# Patient Record
Sex: Male | Born: 1960 | Race: White | Hispanic: No | Smoking: Current every day smoker
Health system: Southern US, Community
[De-identification: ages and names within clinical notes are randomized; demographics above are authoritative.]

## PROBLEM LIST (undated history)

## (undated) DIAGNOSIS — I219 Acute myocardial infarction, unspecified: Secondary | ICD-10-CM

## (undated) DIAGNOSIS — J189 Pneumonia, unspecified organism: Secondary | ICD-10-CM

## (undated) DIAGNOSIS — I255 Ischemic cardiomyopathy: Secondary | ICD-10-CM

## (undated) DIAGNOSIS — Z951 Presence of aortocoronary bypass graft: Secondary | ICD-10-CM

## (undated) DIAGNOSIS — J449 Chronic obstructive pulmonary disease, unspecified: Secondary | ICD-10-CM

## (undated) DIAGNOSIS — I2102 ST elevation (STEMI) myocardial infarction involving left anterior descending coronary artery: Secondary | ICD-10-CM

## (undated) DIAGNOSIS — F17200 Nicotine dependence, unspecified, uncomplicated: Secondary | ICD-10-CM

## (undated) DIAGNOSIS — K279 Peptic ulcer, site unspecified, unspecified as acute or chronic, without hemorrhage or perforation: Secondary | ICD-10-CM

## (undated) DIAGNOSIS — I10 Essential (primary) hypertension: Secondary | ICD-10-CM

## (undated) DIAGNOSIS — I2510 Atherosclerotic heart disease of native coronary artery without angina pectoris: Secondary | ICD-10-CM

## (undated) DIAGNOSIS — E785 Hyperlipidemia, unspecified: Secondary | ICD-10-CM

## (undated) HISTORY — PX: CORONARY ANGIOPLASTY WITH STENT PLACEMENT: SHX49

## (undated) HISTORY — DX: Ischemic cardiomyopathy: I25.5

## (undated) HISTORY — DX: Pneumonia, unspecified organism: J18.9

## (undated) HISTORY — DX: Chronic obstructive pulmonary disease, unspecified: J44.9

## (undated) HISTORY — PX: FRACTURE SURGERY: SHX138

## (undated) HISTORY — DX: Peptic ulcer, site unspecified, unspecified as acute or chronic, without hemorrhage or perforation: K27.9

---

## 1997-01-17 ENCOUNTER — Emergency Department
Admit: 1997-01-17 | Disposition: A | Discharge status: Home / Self Care | Payer: Self-pay | Admitting: Emergency Medicine

## 1998-01-25 ENCOUNTER — Emergency Department (HOSPITAL_COMMUNITY): Admission: EM | Admit: 1998-01-25 | Discharge: 1998-01-25 | Payer: Self-pay | Admitting: Emergency Medicine

## 1998-01-25 ENCOUNTER — Encounter: Payer: Self-pay | Admitting: Emergency Medicine

## 1998-08-28 ENCOUNTER — Emergency Department (HOSPITAL_COMMUNITY): Admission: EM | Admit: 1998-08-28 | Discharge: 1998-08-28 | Payer: Self-pay | Admitting: Emergency Medicine

## 1999-03-01 ENCOUNTER — Emergency Department (HOSPITAL_COMMUNITY): Admission: EM | Admit: 1999-03-01 | Discharge: 1999-03-01 | Payer: Self-pay | Admitting: *Deleted

## 1999-03-18 ENCOUNTER — Other Ambulatory Visit: Payer: Self-pay

## 1999-03-18 ENCOUNTER — Ambulatory Visit: Admit: 1999-03-18 | Disposition: A | Discharge status: Home / Self Care | Payer: Self-pay

## 1999-08-07 ENCOUNTER — Emergency Department (HOSPITAL_COMMUNITY): Admission: EM | Admit: 1999-08-07 | Discharge: 1999-08-07 | Payer: Self-pay | Admitting: Emergency Medicine

## 2000-12-19 ENCOUNTER — Emergency Department: Admit: 2000-12-19 | Disposition: A | Discharge status: Home / Self Care | Payer: Self-pay

## 2000-12-19 ENCOUNTER — Emergency Department
Admit: 2000-12-19 | Disposition: A | Discharge status: Home / Self Care | Payer: Self-pay | Admitting: Emergency Medicine

## 2001-02-18 ENCOUNTER — Emergency Department (HOSPITAL_COMMUNITY): Admission: EM | Admit: 2001-02-18 | Discharge: 2001-02-18 | Payer: Self-pay | Admitting: Emergency Medicine

## 2005-11-06 ENCOUNTER — Emergency Department
Admission: EM | Admit: 2005-11-06 | Disposition: A | Discharge status: Home / Self Care | Payer: Self-pay | Source: Emergency Department | Admitting: Emergency Medicine

## 2009-12-24 ENCOUNTER — Emergency Department (HOSPITAL_BASED_OUTPATIENT_CLINIC_OR_DEPARTMENT_OTHER): Admission: EM | Admit: 2009-12-24 | Discharge: 2009-12-24 | Payer: Self-pay | Admitting: Emergency Medicine

## 2012-09-08 ENCOUNTER — Emergency Department (HOSPITAL_COMMUNITY): Payer: Self-pay

## 2012-09-08 ENCOUNTER — Encounter (HOSPITAL_COMMUNITY): Payer: Self-pay | Admitting: Emergency Medicine

## 2012-09-08 ENCOUNTER — Emergency Department (HOSPITAL_COMMUNITY)
Admission: EM | Admit: 2012-09-08 | Discharge: 2012-09-08 | Disposition: A | Discharge status: Home / Self Care | Payer: Self-pay | Attending: Emergency Medicine | Admitting: Emergency Medicine

## 2012-09-08 DIAGNOSIS — J40 Bronchitis, not specified as acute or chronic: Secondary | ICD-10-CM

## 2012-09-08 DIAGNOSIS — J449 Chronic obstructive pulmonary disease, unspecified: Secondary | ICD-10-CM

## 2012-09-08 DIAGNOSIS — R0789 Other chest pain: Secondary | ICD-10-CM | POA: Insufficient documentation

## 2012-09-08 DIAGNOSIS — R059 Cough, unspecified: Secondary | ICD-10-CM | POA: Insufficient documentation

## 2012-09-08 DIAGNOSIS — I252 Old myocardial infarction: Secondary | ICD-10-CM | POA: Insufficient documentation

## 2012-09-08 DIAGNOSIS — J209 Acute bronchitis, unspecified: Secondary | ICD-10-CM | POA: Insufficient documentation

## 2012-09-08 DIAGNOSIS — J440 Chronic obstructive pulmonary disease with acute lower respiratory infection: Secondary | ICD-10-CM | POA: Insufficient documentation

## 2012-09-08 DIAGNOSIS — F17200 Nicotine dependence, unspecified, uncomplicated: Secondary | ICD-10-CM | POA: Insufficient documentation

## 2012-09-08 DIAGNOSIS — R05 Cough: Secondary | ICD-10-CM | POA: Insufficient documentation

## 2012-09-08 DIAGNOSIS — Z9861 Coronary angioplasty status: Secondary | ICD-10-CM | POA: Insufficient documentation

## 2012-09-08 HISTORY — DX: Acute myocardial infarction, unspecified: I21.9

## 2012-09-08 LAB — CBC WITH DIFFERENTIAL/PLATELET
Basophils Absolute: 0 10*3/uL (ref 0.0–0.1)
Basophils Relative: 0 % (ref 0–1)
Hemoglobin: 17.9 g/dL — ABNORMAL HIGH (ref 13.0–17.0)
MCHC: 35.2 g/dL (ref 30.0–36.0)
Neutro Abs: 4.6 10*3/uL (ref 1.7–7.7)
Neutrophils Relative %: 68 % (ref 43–77)
Platelets: 211 10*3/uL (ref 150–400)
RDW: 13.3 % (ref 11.5–15.5)

## 2012-09-08 LAB — COMPREHENSIVE METABOLIC PANEL
AST: 24 U/L (ref 0–37)
Albumin: 3.2 g/dL — ABNORMAL LOW (ref 3.5–5.2)
Alkaline Phosphatase: 87 U/L (ref 39–117)
Chloride: 97 mEq/L (ref 96–112)
Potassium: 3.8 mEq/L (ref 3.5–5.1)
Sodium: 135 mEq/L (ref 135–145)
Total Bilirubin: 0.2 mg/dL — ABNORMAL LOW (ref 0.3–1.2)

## 2012-09-08 MED ORDER — ALBUTEROL SULFATE HFA 108 (90 BASE) MCG/ACT IN AERS: 1.0000 | INHALATION_SPRAY | Freq: Four times a day (QID) | RESPIRATORY_TRACT | Status: DC | PRN

## 2012-09-08 MED ORDER — DOXYCYCLINE HYCLATE 100 MG PO CAPS: 100.0000 mg | ORAL_CAPSULE | Freq: Two times a day (BID) | ORAL | Status: DC

## 2012-09-08 MED ORDER — ALBUTEROL SULFATE (5 MG/ML) 0.5% IN NEBU
2.5000 mg | INHALATION_SOLUTION | Freq: Once | RESPIRATORY_TRACT | Status: AC
  Administered 2012-09-08: 2.5 mg via RESPIRATORY_TRACT
  Filled 2012-09-08: qty 0.5

## 2012-09-08 MED ORDER — IPRATROPIUM BROMIDE 0.02 % IN SOLN
0.5000 mg | Freq: Once | RESPIRATORY_TRACT | Status: AC
  Administered 2012-09-08: 0.5 mg via RESPIRATORY_TRACT
  Filled 2012-09-08: qty 2.5

## 2012-09-08 MED ORDER — PREDNISONE 20 MG PO TABS
60.0000 mg | ORAL_TABLET | Freq: Once | ORAL | Status: AC
  Administered 2012-09-08: 60 mg via ORAL
  Filled 2012-09-08: qty 3

## 2012-09-08 MED ORDER — PREDNISONE 50 MG PO TABS: ORAL_TABLET | ORAL | Status: DC

## 2012-09-08 NOTE — ED Notes (Signed)
Brother's number to reach mother /brother  [phone]

## 2012-09-08 NOTE — ED Provider Notes (Signed)
History     CSN: [id number]  Arrival date & time 09/08/12  1256   First MD Initiated Contact with Patient 09/08/12 1304      Chief Complaint  Patient presents with  . Cough  . Shortness of Breath    (Consider location/radiation/quality/duration/timing/severity/associated sxs/prior treatment) HPI Comments:  patient presents with a four-day history of shortness of breath and nonproductive cough. He is an active smoker of 2 packs a day. He states he's had a MI 6 years ago with a stent he denies any chest pain similar to his MI but does have some discomfort with coughing. No fever, chills, nausea vomiting or abdominal pain. No leg pain or swelling. No recent travel. No hemoptysis. No previous admissions for breathing problems. Denies any medications at home. Pain in his chest is sharp and stabbing lasts a second at a time with coughing.   The history is provided by the patient.    Past Medical History  Diagnosis Date  . MI (myocardial infarction)     Past Surgical History  Procedure Laterality Date  . Coronary angioplasty with stent placement    . Fracture surgery      History reviewed. No pertinent family history.  History  Substance Use Topics  . Smoking status: Current Every Day Smoker -- 3.00 packs/day  . Smokeless tobacco: Not on file  . Alcohol Use: No      Review of Systems  Constitutional: Negative for fever, activity change and appetite change.  HENT: Negative for congestion and rhinorrhea.   Respiratory: Positive for cough, chest tightness and shortness of breath.   Cardiovascular: Negative for chest pain.  Gastrointestinal: Negative for nausea, vomiting and abdominal pain.  Genitourinary: Negative for dysuria and hematuria.  Musculoskeletal: Negative for back pain.    Allergies  Review of patient's allergies indicates no known allergies.  Home Medications   Current Outpatient Rx  Name  Route  Sig  Dispense  Refill  . ibuprofen (ADVIL,MOTRIN) 200 MG  tablet   Oral   Take 200 mg by mouth every 6 (six) hours as needed for pain (pain).         . pseudoephedrine-guaifenesin (MUCINEX D) 60-600 MG per tablet   Oral   Take 1 tablet by mouth every 12 (twelve) hours.           BP 125/85  Pulse 81  Temp(Src) 98.2 F (36.8 C) (Oral)  Resp 26  SpO2 99%  Physical Exam  Constitutional: He is oriented to person, place, and time. He appears well-developed and well-nourished. No distress.  HENT:  Head: Normocephalic and atraumatic.  Mouth/Throat: Oropharynx is clear and moist. No oropharyngeal exudate.  Eyes: Conjunctivae and EOM are normal. Pupils are equal, round, and reactive to light.  Neck: Normal range of motion. Neck supple.  Cardiovascular: Normal rate, regular rhythm and normal heart sounds.   No murmur heard. Pulmonary/Chest: Effort normal and breath sounds normal. No respiratory distress.  Decreased breath sounds bilaterally with scattered respiratory wheezing  Abdominal: Soft. There is no tenderness. There is no rebound and no guarding.  Musculoskeletal: Normal range of motion. He exhibits no edema and no tenderness.  Neurological: He is alert and oriented to person, place, and time. No cranial nerve deficit. He exhibits normal muscle tone. Coordination normal.  Skin: Skin is warm.    ED Course  Procedures (including critical care time)  Labs Reviewed  CBC WITH DIFFERENTIAL - Abnormal; Notable for the following:    Hemoglobin 17.9 (*)  Monocytes Relative 13 (*)    All other components within normal limits  COMPREHENSIVE METABOLIC PANEL - Abnormal; Notable for the following:    Albumin 3.2 (*)    Total Bilirubin 0.2 (*)    GFR calc non Af Amer 77 (*)    GFR calc Af Amer 89 (*)    All other components within normal limits  TROPONIN I  D-DIMER, QUANTITATIVE  PRO B NATRIURETIC PEPTIDE   Dg Chest 2 View  09/08/2012  *RADIOLOGY REPORT*  Clinical Data: Shortness of breath, cough and chills.  CHEST - 2 VIEW   Comparison: None.  Findings: Mild and diffuse interstitial prominence of the lungs may be consistent with tobacco use/chronic disease.  No focal infiltrate, nodule, edema or pleural fluid is seen.  The heart size is normal.  The bony thorax is unremarkable.  IMPRESSION: No active disease.  Suspect underlying chronic interstitial lung disease.   Original Report Authenticated By: Irish Lack, M.D.      No diagnosis found.    MDM  Four-day history of shortness of breath with nonproductive cough and chest pain with coughing. Active smoker. Previous MI with stent.  Chest x-ray negative for infiltrate. Patient given nebs, steroids with improvement in shortness of breath and with breathing. He is not wheezing on exam.  troponin negative. D-dimer negative. Suspect undiagnosed COPD complicated by bronchitis.  Resource guide given.  Ambulatory in hallways without desaturation.   Date: 09/08/2012  Rate: 84  Rhythm: normal sinus rhythm  QRS Axis: normal  Intervals: normal  ST/T Wave abnormalities: normal  Conduction Disutrbances:none  Narrative Interpretation:   Old EKG Reviewed: none available       Glynn Octave, MD 09/08/12 1553

## 2012-09-08 NOTE — ED Notes (Signed)
Patient ambulated in hallway. Maintained 02 saturation between 93% and 97%.

## 2012-09-08 NOTE — ED Notes (Addendum)
Pt states that he has had a cough & sob x 2 days.  States it is hard to take a deep breath.  Hx of MI and stent placement.

## 2013-12-18 ENCOUNTER — Encounter (HOSPITAL_COMMUNITY)
Admission: EM | Disposition: A | Discharge status: Home / Self Care | Payer: Self-pay | Source: Home / Self Care | Attending: Surgery

## 2013-12-18 ENCOUNTER — Inpatient Hospital Stay (HOSPITAL_COMMUNITY): Payer: Self-pay | Admitting: Certified Registered"

## 2013-12-18 ENCOUNTER — Encounter (HOSPITAL_COMMUNITY): Payer: Self-pay | Admitting: Emergency Medicine

## 2013-12-18 ENCOUNTER — Inpatient Hospital Stay (HOSPITAL_COMMUNITY): Payer: Self-pay

## 2013-12-18 ENCOUNTER — Encounter (HOSPITAL_COMMUNITY): Payer: MEDICAID | Admitting: Certified Registered"

## 2013-12-18 ENCOUNTER — Emergency Department (HOSPITAL_COMMUNITY): Payer: Self-pay

## 2013-12-18 ENCOUNTER — Other Ambulatory Visit: Payer: Self-pay | Admitting: *Deleted

## 2013-12-18 ENCOUNTER — Inpatient Hospital Stay (HOSPITAL_COMMUNITY)
Admission: EM | Admit: 2013-12-18 | Discharge: 2013-12-23 | DRG: 232 | Disposition: A | Discharge status: Home / Self Care | Payer: Self-pay | Attending: Surgery | Admitting: Surgery

## 2013-12-18 DIAGNOSIS — K5900 Constipation, unspecified: Secondary | ICD-10-CM | POA: Diagnosis not present

## 2013-12-18 DIAGNOSIS — I2510 Atherosclerotic heart disease of native coronary artery without angina pectoris: Secondary | ICD-10-CM

## 2013-12-18 DIAGNOSIS — E119 Type 2 diabetes mellitus without complications: Secondary | ICD-10-CM | POA: Diagnosis present

## 2013-12-18 DIAGNOSIS — I159 Secondary hypertension, unspecified: Secondary | ICD-10-CM

## 2013-12-18 DIAGNOSIS — F17200 Nicotine dependence, unspecified, uncomplicated: Secondary | ICD-10-CM | POA: Diagnosis present

## 2013-12-18 DIAGNOSIS — I219 Acute myocardial infarction, unspecified: Secondary | ICD-10-CM

## 2013-12-18 DIAGNOSIS — F411 Generalized anxiety disorder: Secondary | ICD-10-CM | POA: Diagnosis not present

## 2013-12-18 DIAGNOSIS — I517 Cardiomegaly: Secondary | ICD-10-CM

## 2013-12-18 DIAGNOSIS — I10 Essential (primary) hypertension: Secondary | ICD-10-CM | POA: Diagnosis present

## 2013-12-18 DIAGNOSIS — Z01810 Encounter for preprocedural cardiovascular examination: Secondary | ICD-10-CM

## 2013-12-18 DIAGNOSIS — I2582 Chronic total occlusion of coronary artery: Secondary | ICD-10-CM | POA: Diagnosis present

## 2013-12-18 DIAGNOSIS — Z9861 Coronary angioplasty status: Secondary | ICD-10-CM

## 2013-12-18 DIAGNOSIS — D62 Acute posthemorrhagic anemia: Secondary | ICD-10-CM | POA: Diagnosis not present

## 2013-12-18 DIAGNOSIS — E8779 Other fluid overload: Secondary | ICD-10-CM | POA: Diagnosis not present

## 2013-12-18 DIAGNOSIS — I2109 ST elevation (STEMI) myocardial infarction involving other coronary artery of anterior wall: Principal | ICD-10-CM | POA: Diagnosis present

## 2013-12-18 DIAGNOSIS — I2102 ST elevation (STEMI) myocardial infarction involving left anterior descending coronary artery: Secondary | ICD-10-CM | POA: Diagnosis present

## 2013-12-18 DIAGNOSIS — I213 ST elevation (STEMI) myocardial infarction of unspecified site: Secondary | ICD-10-CM

## 2013-12-18 DIAGNOSIS — E785 Hyperlipidemia, unspecified: Secondary | ICD-10-CM | POA: Diagnosis present

## 2013-12-18 DIAGNOSIS — I252 Old myocardial infarction: Secondary | ICD-10-CM

## 2013-12-18 DIAGNOSIS — Z23 Encounter for immunization: Secondary | ICD-10-CM

## 2013-12-18 DIAGNOSIS — Z951 Presence of aortocoronary bypass graft: Secondary | ICD-10-CM

## 2013-12-18 HISTORY — DX: Atherosclerotic heart disease of native coronary artery without angina pectoris: I25.10

## 2013-12-18 HISTORY — PX: INTRAOPERATIVE TRANSESOPHAGEAL ECHOCARDIOGRAM: SHX5062

## 2013-12-18 HISTORY — PX: LEFT HEART CATHETERIZATION WITH CORONARY ANGIOGRAM: SHX5451

## 2013-12-18 HISTORY — DX: Essential (primary) hypertension: I10

## 2013-12-18 HISTORY — DX: Presence of aortocoronary bypass graft: Z95.1

## 2013-12-18 HISTORY — DX: Hyperlipidemia, unspecified: E78.5

## 2013-12-18 HISTORY — DX: ST elevation (STEMI) myocardial infarction involving left anterior descending coronary artery: I21.02

## 2013-12-18 HISTORY — PX: CORONARY ARTERY BYPASS GRAFT: SHX141

## 2013-12-18 HISTORY — DX: Nicotine dependence, unspecified, uncomplicated: F17.200

## 2013-12-18 LAB — MRSA PCR SCREENING: MRSA BY PCR: NEGATIVE

## 2013-12-18 LAB — COMPREHENSIVE METABOLIC PANEL
ALBUMIN: 4.1 g/dL (ref 3.5–5.2)
ALBUMIN: 3.6 g/dL (ref 3.5–5.2)
ALK PHOS: 98 U/L (ref 39–117)
ALT: 17 U/L (ref 0–53)
ALT: 19 U/L (ref 0–53)
ANION GAP: 17 — AB (ref 5–15)
AST: 33 U/L (ref 0–37)
AST: 53 U/L — AB (ref 0–37)
Alkaline Phosphatase: 117 U/L (ref 39–117)
Anion gap: 15 (ref 5–15)
BUN: 12 mg/dL (ref 6–23)
BUN: 10 mg/dL (ref 6–23)
CALCIUM: 9.8 mg/dL (ref 8.4–10.5)
CHLORIDE: 99 meq/L (ref 96–112)
CO2: 22 mEq/L (ref 19–32)
CO2: 26 mEq/L (ref 19–32)
Calcium: 8.9 mg/dL (ref 8.4–10.5)
Chloride: 98 mEq/L (ref 96–112)
Creatinine, Ser: 0.98 mg/dL (ref 0.50–1.35)
Creatinine, Ser: 0.85 mg/dL (ref 0.50–1.35)
GFR calc Af Amer: >90 mL/min (ref >90)
GFR calc non Af Amer: >90 mL/min (ref >90)
GFR calc non Af Amer: >90 mL/min (ref >90)
GLUCOSE: 121 mg/dL — AB (ref 70–99)
Glucose, Bld: 103 mg/dL — ABNORMAL HIGH (ref 70–99)
POTASSIUM: 4 meq/L (ref 3.7–5.3)
Potassium: 4.1 mEq/L (ref 3.7–5.3)
SODIUM: 137 meq/L (ref 137–147)
Sodium: 140 mEq/L (ref 137–147)
TOTAL PROTEIN: 8.7 g/dL — AB (ref 6.0–8.3)
Total Bilirubin: 0.5 mg/dL (ref 0.3–1.2)
Total Bilirubin: 0.5 mg/dL (ref 0.3–1.2)
Total Protein: 7.5 g/dL (ref 6.0–8.3)

## 2013-12-18 LAB — CBC WITH DIFFERENTIAL/PLATELET
BASOS ABS: 0 10*3/uL (ref 0.0–0.1)
Basophils Relative: 0 % (ref 0–1)
Eosinophils Absolute: 0 10*3/uL (ref 0.0–0.7)
Eosinophils Relative: 0 % (ref 0–5)
HCT: 47.9 % (ref 39.0–52.0)
HEMOGLOBIN: 16.3 g/dL (ref 13.0–17.0)
LYMPHS ABS: 1.3 10*3/uL (ref 0.7–4.0)
Lymphocytes Relative: 11 % — ABNORMAL LOW (ref 12–46)
MCH: 30.5 pg (ref 26.0–34.0)
MCHC: 34 g/dL (ref 30.0–36.0)
MCV: 89.7 fL (ref 78.0–100.0)
MONO ABS: 0.6 10*3/uL (ref 0.1–1.0)
MONOS PCT: 5 % (ref 3–12)
NEUTROS PCT: 84 % — AB (ref 43–77)
Neutro Abs: 10 10*3/uL — ABNORMAL HIGH (ref 1.7–7.7)
Platelets: 266 10*3/uL (ref 150–400)
RBC: 5.34 MIL/uL (ref 4.22–5.81)
RDW: 13.9 % (ref 11.5–15.5)
WBC: 11.8 10*3/uL — ABNORMAL HIGH (ref 4.0–10.5)

## 2013-12-18 LAB — POCT I-STAT 3, ART BLOOD GAS (G3+)
ACID-BASE DEFICIT: 2 mmol/L (ref 0.0–2.0)
ACID-BASE DEFICIT: 1 mmol/L (ref 0.0–2.0)
ACID-BASE DEFICIT: 2 mmol/L (ref 0.0–2.0)
Acid-base deficit: 1 mmol/L (ref 0.0–2.0)
Acid-base deficit: 1 mmol/L (ref 0.0–2.0)
Acid-base deficit: 4 mmol/L — ABNORMAL HIGH (ref 0.0–2.0)
Acid-base deficit: 3 mmol/L — ABNORMAL HIGH (ref 0.0–2.0)
BICARBONATE: 23.7 meq/L (ref 20.0–24.0)
Bicarbonate: 25.3 mEq/L — ABNORMAL HIGH (ref 20.0–24.0)
Bicarbonate: 21.7 mEq/L (ref 20.0–24.0)
Bicarbonate: 24.4 mEq/L — ABNORMAL HIGH (ref 20.0–24.0)
Bicarbonate: 24.2 mEq/L — ABNORMAL HIGH (ref 20.0–24.0)
Bicarbonate: 22.8 mEq/L (ref 20.0–24.0)
Bicarbonate: 23.4 mEq/L (ref 20.0–24.0)
O2 SAT: 100 %
O2 Saturation: 100 %
O2 Saturation: 84 %
O2 Saturation: 94 %
O2 Saturation: 93 %
O2 Saturation: 95 %
O2 Saturation: 94 %
PCO2 ART: 46.2 mmHg — AB (ref 35.0–45.0)
PCO2 ART: 36 mmHg (ref 35.0–45.0)
PCO2 ART: 39.7 mmHg (ref 35.0–45.0)
PH ART: 7.346 — AB (ref 7.350–7.450)
PH ART: 7.337 — AB (ref 7.350–7.450)
PH ART: 7.358 (ref 7.350–7.450)
PO2 ART: 502 mmHg — AB (ref 80.0–100.0)
PO2 ART: 52 mmHg — AB (ref 80.0–100.0)
PO2 ART: 76 mmHg — AB (ref 80.0–100.0)
PO2 ART: 69 mmHg — AB (ref 80.0–100.0)
PO2 ART: 77 mmHg — AB (ref 80.0–100.0)
PO2 ART: 73 mmHg — AB (ref 80.0–100.0)
Patient temperature: 37.2
Patient temperature: 37.2
TCO2: 27 mmol/L (ref 0–100)
TCO2: 23 mmol/L (ref 0–100)
TCO2: 26 mmol/L (ref 0–100)
TCO2: 25 mmol/L (ref 0–100)
TCO2: 25 mmol/L (ref 0–100)
TCO2: 24 mmol/L (ref 0–100)
TCO2: 25 mmol/L (ref 0–100)
pCO2 arterial: 40.6 mmHg (ref 35.0–45.0)
pCO2 arterial: 42.3 mmHg (ref 35.0–45.0)
pCO2 arterial: 47.3 mmHg — ABNORMAL HIGH (ref 35.0–45.0)
pCO2 arterial: 42.8 mmHg (ref 35.0–45.0)
pH, Arterial: 7.369 (ref 7.350–7.450)
pH, Arterial: 7.307 — ABNORMAL LOW (ref 7.350–7.450)
pH, Arterial: 7.411 (ref 7.350–7.450)
pH, Arterial: 7.38 (ref 7.350–7.450)
pO2, Arterial: 347 mmHg — ABNORMAL HIGH (ref 80.0–100.0)

## 2013-12-18 LAB — CBC
HCT: 51.4 % (ref 39.0–52.0)
HEMATOCRIT: 40.8 % (ref 39.0–52.0)
HEMOGLOBIN: 18 g/dL — AB (ref 13.0–17.0)
HEMOGLOBIN: 13.9 g/dL (ref 13.0–17.0)
MCH: 30.7 pg (ref 26.0–34.0)
MCH: 30.2 pg (ref 26.0–34.0)
MCHC: 35 g/dL (ref 30.0–36.0)
MCHC: 34.1 g/dL (ref 30.0–36.0)
MCV: 87.7 fL (ref 78.0–100.0)
MCV: 88.5 fL (ref 78.0–100.0)
Platelets: 322 10*3/uL (ref 150–400)
Platelets: 186 10*3/uL (ref 150–400)
RBC: 5.86 MIL/uL — ABNORMAL HIGH (ref 4.22–5.81)
RBC: 4.61 MIL/uL (ref 4.22–5.81)
RDW: 13.5 % (ref 11.5–15.5)
RDW: 13.9 % (ref 11.5–15.5)
WBC: 16.3 10*3/uL — ABNORMAL HIGH (ref 4.0–10.5)
WBC: 20.7 10*3/uL — ABNORMAL HIGH (ref 4.0–10.5)

## 2013-12-18 LAB — PULMONARY FUNCTION TEST
FEF 25-75 Post: 1.1 L/sec
FEF 25-75 Pre: 1.33 L/sec
FEF2575-%Change-Post: -17 %
FEF2575-%Pred-Post: 30 %
FEF2575-%Pred-Pre: 36 %
FEV1-%Change-Post: -13 %
FEV1-%PRED-POST: 32 %
FEV1-%Pred-Pre: 37 %
FEV1-POST: 1.36 L
FEV1-Pre: 1.57 L
FEV1FVC-%CHANGE-POST: -11 %
FEV1FVC-%Pred-Pre: 93 %
FEV6-%Change-Post: -1 %
FEV6-%Pred-Post: 40 %
FEV6-%Pred-Pre: 41 %
FEV6-Post: 2.11 L
FEV6-Pre: 2.15 L
FEV6FVC-%PRED-PRE: 104 %
FEV6FVC-%Pred-Post: 104 %
FVC-%Change-Post: -1 %
FVC-%PRED-POST: 39 %
FVC-%Pred-Pre: 40 %
FVC-PRE: 2.15 L
FVC-Post: 2.11 L
POST FEV6/FVC RATIO: 100 %
PRE FEV6/FVC RATIO: 100 %
Post FEV1/FVC ratio: 64 %
Pre FEV1/FVC ratio: 73 %

## 2013-12-18 LAB — TROPONIN I
Troponin I: 9.48 ng/mL (ref <0.30)
Troponin I: 19.91 ng/mL (ref <0.30)

## 2013-12-18 LAB — POCT I-STAT, CHEM 8
BUN: 10 mg/dL (ref 6–23)
BUN: 5 mg/dL — ABNORMAL LOW (ref 6–23)
BUN: 7 mg/dL (ref 6–23)
BUN: 6 mg/dL (ref 6–23)
BUN: 7 mg/dL (ref 6–23)
CALCIUM ION: 0.89 mmol/L — AB (ref 1.12–1.23)
CALCIUM ION: 1.24 mmol/L — AB (ref 1.12–1.23)
CHLORIDE: 105 meq/L (ref 96–112)
CHLORIDE: 109 meq/L (ref 96–112)
CHLORIDE: 101 meq/L (ref 96–112)
CREATININE: 0.7 mg/dL (ref 0.50–1.35)
CREATININE: 0.6 mg/dL (ref 0.50–1.35)
Calcium, Ion: 1.16 mmol/L (ref 1.12–1.23)
Calcium, Ion: 1.23 mmol/L (ref 1.12–1.23)
Calcium, Ion: 1.04 mmol/L — ABNORMAL LOW (ref 1.12–1.23)
Chloride: 111 mEq/L (ref 96–112)
Chloride: 99 mEq/L (ref 96–112)
Creatinine, Ser: 0.9 mg/dL (ref 0.50–1.35)
Creatinine, Ser: 0.5 mg/dL (ref 0.50–1.35)
Creatinine, Ser: 0.7 mg/dL (ref 0.50–1.35)
GLUCOSE: 130 mg/dL — AB (ref 70–99)
GLUCOSE: 95 mg/dL (ref 70–99)
GLUCOSE: 130 mg/dL — AB (ref 70–99)
Glucose, Bld: 100 mg/dL — ABNORMAL HIGH (ref 70–99)
Glucose, Bld: 89 mg/dL (ref 70–99)
HCT: 51 % (ref 39.0–52.0)
HCT: 40 % (ref 39.0–52.0)
HCT: 29 % — ABNORMAL LOW (ref 39.0–52.0)
HCT: 35 % — ABNORMAL LOW (ref 39.0–52.0)
HEMATOCRIT: 31 % — AB (ref 39.0–52.0)
HEMOGLOBIN: 17.3 g/dL — AB (ref 13.0–17.0)
HEMOGLOBIN: 13.6 g/dL (ref 13.0–17.0)
Hemoglobin: 10.5 g/dL — ABNORMAL LOW (ref 13.0–17.0)
Hemoglobin: 9.9 g/dL — ABNORMAL LOW (ref 13.0–17.0)
Hemoglobin: 11.9 g/dL — ABNORMAL LOW (ref 13.0–17.0)
POTASSIUM: 3.3 meq/L — AB (ref 3.7–5.3)
POTASSIUM: 4 meq/L (ref 3.7–5.3)
POTASSIUM: 3.4 meq/L — AB (ref 3.7–5.3)
Potassium: 3.6 mEq/L — ABNORMAL LOW (ref 3.7–5.3)
Potassium: 3.3 mEq/L — ABNORMAL LOW (ref 3.7–5.3)
SODIUM: 138 meq/L (ref 137–147)
SODIUM: 141 meq/L (ref 137–147)
Sodium: 135 mEq/L — ABNORMAL LOW (ref 137–147)
Sodium: 134 mEq/L — ABNORMAL LOW (ref 137–147)
Sodium: 141 mEq/L (ref 137–147)
TCO2: 20 mmol/L (ref 0–100)
TCO2: 23 mmol/L (ref 0–100)
TCO2: 26 mmol/L (ref 0–100)
TCO2: 24 mmol/L (ref 0–100)
TCO2: 22 mmol/L (ref 0–100)

## 2013-12-18 LAB — POCT I-STAT 4, (NA,K, GLUC, HGB,HCT)
Glucose, Bld: 113 mg/dL — ABNORMAL HIGH (ref 70–99)
HEMATOCRIT: 41 % (ref 39.0–52.0)
HEMOGLOBIN: 13.9 g/dL (ref 13.0–17.0)
Potassium: 3.5 mEq/L — ABNORMAL LOW (ref 3.7–5.3)
Sodium: 140 mEq/L (ref 137–147)

## 2013-12-18 LAB — I-STAT TROPONIN, ED: Troponin i, poc: 1.17 ng/mL (ref 0.00–0.08)

## 2013-12-18 LAB — SURGICAL PCR SCREEN
MRSA, PCR: NEGATIVE
Staphylococcus aureus: POSITIVE — AB

## 2013-12-18 LAB — PROTIME-INR
INR: 0.99 (ref 0.00–1.49)
INR: 1.17 (ref 0.00–1.49)
INR: 1.33 (ref 0.00–1.49)
PROTHROMBIN TIME: 13.1 s (ref 11.6–15.2)
PROTHROMBIN TIME: 14.9 s (ref 11.6–15.2)
Prothrombin Time: 16.5 seconds — ABNORMAL HIGH (ref 11.6–15.2)

## 2013-12-18 LAB — TYPE AND SCREEN
ABO/RH(D): O POS
Antibody Screen: NEGATIVE

## 2013-12-18 LAB — HEMOGLOBIN A1C
Hgb A1c MFr Bld: 5.6 % (ref <5.7)
Mean Plasma Glucose: 114 mg/dL (ref <117)

## 2013-12-18 LAB — BLOOD GAS, ARTERIAL
ACID-BASE DEFICIT: 0.8 mmol/L (ref 0.0–2.0)
Bicarbonate: 23.5 mEq/L (ref 20.0–24.0)
Drawn by: 24485
FIO2: 0.21 %
O2 Saturation: 94.9 %
Patient temperature: 98.3
TCO2: 24.8 mmol/L (ref 0–100)
pCO2 arterial: 40.1 mmHg (ref 35.0–45.0)
pH, Arterial: 7.386 (ref 7.350–7.450)
pO2, Arterial: 72.7 mmHg — ABNORMAL LOW (ref 80.0–100.0)

## 2013-12-18 LAB — URINALYSIS, ROUTINE W REFLEX MICROSCOPIC
Bilirubin Urine: NEGATIVE
GLUCOSE, UA: NEGATIVE mg/dL
KETONES UR: 40 mg/dL — AB
LEUKOCYTES UA: NEGATIVE
Nitrite: NEGATIVE
PH: 7 (ref 5.0–8.0)
Protein, ur: NEGATIVE mg/dL
Specific Gravity, Urine: >1.046 — ABNORMAL HIGH (ref 1.005–1.030)
Urobilinogen, UA: 1 mg/dL (ref 0.0–1.0)

## 2013-12-18 LAB — HEMOGLOBIN AND HEMATOCRIT, BLOOD
HCT: 31.1 % — ABNORMAL LOW (ref 39.0–52.0)
HEMOGLOBIN: 10.6 g/dL — AB (ref 13.0–17.0)

## 2013-12-18 LAB — GLUCOSE, CAPILLARY
GLUCOSE-CAPILLARY: 113 mg/dL — AB (ref 70–99)
Glucose-Capillary: 110 mg/dL — ABNORMAL HIGH (ref 70–99)
Glucose-Capillary: 108 mg/dL — ABNORMAL HIGH (ref 70–99)
Glucose-Capillary: 106 mg/dL — ABNORMAL HIGH (ref 70–99)
Glucose-Capillary: 104 mg/dL — ABNORMAL HIGH (ref 70–99)

## 2013-12-18 LAB — MAGNESIUM: Magnesium: 2.3 mg/dL (ref 1.5–2.5)

## 2013-12-18 LAB — ABO/RH: ABO/RH(D): O POS

## 2013-12-18 LAB — TSH: TSH: 0.431 u[IU]/mL (ref 0.350–4.500)

## 2013-12-18 LAB — APTT
APTT: 29 s (ref 24–37)
aPTT: 55 seconds — ABNORMAL HIGH (ref 24–37)
aPTT: 32 seconds (ref 24–37)

## 2013-12-18 LAB — POCT ACTIVATED CLOTTING TIME: ACTIVATED CLOTTING TIME: 501 s

## 2013-12-18 LAB — PLATELET COUNT: Platelets: 173 10*3/uL (ref 150–400)

## 2013-12-18 LAB — URINE MICROSCOPIC-ADD ON

## 2013-12-18 SURGERY — CORONARY ARTERY BYPASS GRAFTING (CABG)
Anesthesia: General | Site: Chest

## 2013-12-18 SURGERY — LEFT HEART CATHETERIZATION WITH CORONARY ANGIOGRAM
Anesthesia: LOCAL

## 2013-12-18 MED ORDER — POTASSIUM CHLORIDE 2 MEQ/ML IV SOLN
80.0000 meq | INTRAVENOUS | Status: DC
  Filled 2013-12-18: qty 40

## 2013-12-18 MED ORDER — SODIUM CHLORIDE 0.9 % IV SOLN
INTRAVENOUS | Status: DC
  Administered 2013-12-18: 10 mL via INTRAVENOUS

## 2013-12-18 MED ORDER — NITROGLYCERIN 0.2 MG/ML ON CALL CATH LAB
INTRAVENOUS | Status: AC
Start: 2013-12-18 — End: 2013-12-18
  Filled 2013-12-18: qty 1

## 2013-12-18 MED ORDER — PROPOFOL 10 MG/ML IV BOLUS
INTRAVENOUS | Status: AC
  Filled 2013-12-18: qty 20

## 2013-12-18 MED ORDER — MAGNESIUM SULFATE 50 % IJ SOLN
40.0000 meq | INTRAMUSCULAR | Status: DC
  Filled 2013-12-18: qty 10

## 2013-12-18 MED ORDER — FENTANYL CITRATE 0.05 MG/ML IJ SOLN
INTRAMUSCULAR | Status: AC
  Filled 2013-12-18: qty 5

## 2013-12-18 MED ORDER — DEXMEDETOMIDINE HCL IN NACL 200 MCG/50ML IV SOLN
0.1000 ug/kg/h | INTRAVENOUS | Status: DC
  Administered 2013-12-18: 0.7 ug/kg/h via INTRAVENOUS
  Filled 2013-12-18: qty 500

## 2013-12-18 MED ORDER — THROMBIN 20000 UNITS EX SOLR
OROMUCOSAL | Status: DC | PRN
  Administered 2013-12-18 (×3): via TOPICAL

## 2013-12-18 MED ORDER — ASPIRIN EC 325 MG PO TBEC
325.0000 mg | DELAYED_RELEASE_TABLET | Freq: Every day | ORAL | Status: DC
  Administered 2013-12-19 – 2013-12-20 (×2): 325 mg via ORAL
  Filled 2013-12-18 (×2): qty 1

## 2013-12-18 MED ORDER — MIDAZOLAM HCL 2 MG/2ML IJ SOLN
INTRAMUSCULAR | Status: AC
  Filled 2013-12-18: qty 2

## 2013-12-18 MED ORDER — BISACODYL 5 MG PO TBEC: 5.0000 mg | DELAYED_RELEASE_TABLET | Freq: Once | ORAL | Status: DC

## 2013-12-18 MED ORDER — BIVALIRUDIN 250 MG IV SOLR
INTRAVENOUS | Status: AC
  Filled 2013-12-18: qty 250

## 2013-12-18 MED ORDER — FENTANYL CITRATE 0.05 MG/ML IJ SOLN
INTRAMUSCULAR | Status: DC | PRN
  Administered 2013-12-18: 50 ug via INTRAVENOUS
  Administered 2013-12-18: 200 ug via INTRAVENOUS
  Administered 2013-12-18: 250 ug via INTRAVENOUS
  Administered 2013-12-18: 150 ug via INTRAVENOUS
  Administered 2013-12-18: 100 ug via INTRAVENOUS
  Administered 2013-12-18: 250 ug via INTRAVENOUS
  Administered 2013-12-18: 500 ug via INTRAVENOUS

## 2013-12-18 MED ORDER — DEXTROSE 5 % IV SOLN
0.0000 ug/min | INTRAVENOUS | Status: DC
  Filled 2013-12-18: qty 2

## 2013-12-18 MED ORDER — TEMAZEPAM 15 MG PO CAPS: 15.0000 mg | ORAL_CAPSULE | Freq: Once | ORAL | Status: DC | PRN

## 2013-12-18 MED ORDER — SODIUM CHLORIDE 0.9 % IJ SOLN: 3.0000 mL | INTRAMUSCULAR | Status: DC | PRN

## 2013-12-18 MED ORDER — DEXTROSE 5 % IV SOLN
1.5000 g | Freq: Two times a day (BID) | INTRAVENOUS | Status: AC
  Administered 2013-12-18 – 2013-12-20 (×4): 1.5 g via INTRAVENOUS
  Filled 2013-12-18 (×4): qty 1.5

## 2013-12-18 MED ORDER — CEFUROXIME SODIUM 1.5 G IJ SOLR
1.5000 g | INTRAMUSCULAR | Status: AC
  Administered 2013-12-18: 1.5 g via INTRAVENOUS
  Administered 2013-12-18: .75 g via INTRAVENOUS
  Filled 2013-12-18: qty 1.5

## 2013-12-18 MED ORDER — ALBUTEROL SULFATE (2.5 MG/3ML) 0.083% IN NEBU
2.5000 mg | INHALATION_SOLUTION | Freq: Once | RESPIRATORY_TRACT | Status: AC
  Administered 2013-12-18: 2.5 mg via RESPIRATORY_TRACT

## 2013-12-18 MED ORDER — VECURONIUM BROMIDE 10 MG IV SOLR
INTRAVENOUS | Status: DC | PRN
  Administered 2013-12-18 (×4): 5 mg via INTRAVENOUS

## 2013-12-18 MED ORDER — PHENYLEPHRINE HCL 10 MG/ML IJ SOLN
30.0000 ug/min | INTRAVENOUS | Status: AC
  Administered 2013-12-18: 10 ug/min via INTRAVENOUS
  Filled 2013-12-18: qty 2

## 2013-12-18 MED ORDER — DEXTROSE 5 % IV SOLN
750.0000 mg | INTRAVENOUS | Status: DC
  Filled 2013-12-18: qty 750

## 2013-12-18 MED ORDER — HEPARIN SODIUM (PORCINE) 1000 UNIT/ML IJ SOLN
INTRAMUSCULAR | Status: DC | PRN
  Administered 2013-12-18: 37000 [IU] via INTRAVENOUS

## 2013-12-18 MED ORDER — NITROGLYCERIN 0.4 MG SL SUBL: 0.4000 mg | SUBLINGUAL_TABLET | SUBLINGUAL | Status: DC | PRN

## 2013-12-18 MED ORDER — METOPROLOL TARTRATE 12.5 MG HALF TABLET
12.5000 mg | ORAL_TABLET | Freq: Once | ORAL | Status: AC
  Administered 2013-12-18: 12.5 mg via ORAL
  Filled 2013-12-18: qty 1

## 2013-12-18 MED ORDER — METOPROLOL TARTRATE 1 MG/ML IV SOLN: 2.5000 mg | INTRAVENOUS | Status: DC | PRN

## 2013-12-18 MED ORDER — THROMBIN 20000 UNITS EX SOLR
CUTANEOUS | Status: DC | PRN
  Administered 2013-12-18: 20000 [IU] via TOPICAL

## 2013-12-18 MED ORDER — METOPROLOL TARTRATE 12.5 MG HALF TABLET
12.5000 mg | ORAL_TABLET | Freq: Two times a day (BID) | ORAL | Status: DC
  Administered 2013-12-19 – 2013-12-20 (×2): 12.5 mg via ORAL
  Filled 2013-12-18 (×5): qty 1

## 2013-12-18 MED ORDER — ACETAMINOPHEN 325 MG PO TABS: 650.0000 mg | ORAL_TABLET | ORAL | Status: DC | PRN

## 2013-12-18 MED ORDER — METOCLOPRAMIDE HCL 5 MG/ML IJ SOLN
10.0000 mg | Freq: Four times a day (QID) | INTRAMUSCULAR | Status: AC
  Administered 2013-12-19 (×3): 10 mg via INTRAVENOUS
  Filled 2013-12-18 (×2): qty 2

## 2013-12-18 MED ORDER — ATORVASTATIN CALCIUM 40 MG PO TABS
40.0000 mg | ORAL_TABLET | Freq: Every day | ORAL | Status: DC
  Administered 2013-12-19 – 2013-12-22 (×4): 40 mg via ORAL
  Filled 2013-12-18 (×6): qty 1

## 2013-12-18 MED ORDER — ACETAMINOPHEN 650 MG RE SUPP
650.0000 mg | Freq: Once | RECTAL | Status: AC
  Administered 2013-12-18: 650 mg via RECTAL

## 2013-12-18 MED ORDER — THROMBIN 20000 UNITS EX SOLR
CUTANEOUS | Status: AC
  Filled 2013-12-18: qty 20000

## 2013-12-18 MED ORDER — MIDAZOLAM HCL 5 MG/5ML IJ SOLN
INTRAMUSCULAR | Status: DC | PRN
  Administered 2013-12-18 (×6): 2 mg via INTRAVENOUS

## 2013-12-18 MED ORDER — ASPIRIN 81 MG PO CHEW
CHEWABLE_TABLET | ORAL | Status: AC
  Filled 2013-12-18: qty 4

## 2013-12-18 MED ORDER — SODIUM CHLORIDE 0.9 % IV SOLN
INTRAVENOUS | Status: DC
  Filled 2013-12-18: qty 1

## 2013-12-18 MED ORDER — POTASSIUM CHLORIDE 10 MEQ/50ML IV SOLN
10.0000 meq | INTRAVENOUS | Status: AC
  Administered 2013-12-18 (×3): 10 meq via INTRAVENOUS

## 2013-12-18 MED ORDER — CHLORHEXIDINE GLUCONATE CLOTH 2 % EX PADS: 6.0000 | MEDICATED_PAD | Freq: Once | CUTANEOUS | Status: DC

## 2013-12-18 MED ORDER — ALBUMIN HUMAN 5 % IV SOLN
250.0000 mL | INTRAVENOUS | Status: AC | PRN
  Administered 2013-12-18: 250 mL via INTRAVENOUS

## 2013-12-18 MED ORDER — VERAPAMIL HCL 2.5 MG/ML IV SOLN
INTRAVENOUS | Status: AC
  Filled 2013-12-18: qty 2

## 2013-12-18 MED ORDER — VECURONIUM BROMIDE 10 MG IV SOLR
INTRAVENOUS | Status: AC
  Filled 2013-12-18: qty 10

## 2013-12-18 MED ORDER — ONDANSETRON HCL 4 MG/2ML IJ SOLN
4.0000 mg | Freq: Four times a day (QID) | INTRAMUSCULAR | Status: DC | PRN
  Administered 2013-12-19: 4 mg via INTRAVENOUS
  Filled 2013-12-18: qty 2

## 2013-12-18 MED ORDER — LACTATED RINGERS IV SOLN: 500.0000 mL | Freq: Once | INTRAVENOUS | Status: AC | PRN

## 2013-12-18 MED ORDER — DOPAMINE-DEXTROSE 3.2-5 MG/ML-% IV SOLN
2.0000 ug/kg/min | INTRAVENOUS | Status: DC
  Filled 2013-12-18: qty 250

## 2013-12-18 MED ORDER — BISACODYL 10 MG RE SUPP: 10.0000 mg | Freq: Every day | RECTAL | Status: DC

## 2013-12-18 MED ORDER — INSULIN REGULAR BOLUS VIA INFUSION
0.0000 [IU] | Freq: Three times a day (TID) | INTRAVENOUS | Status: DC
  Filled 2013-12-18: qty 10

## 2013-12-18 MED ORDER — ONDANSETRON HCL 4 MG/2ML IJ SOLN: 4.0000 mg | Freq: Four times a day (QID) | INTRAMUSCULAR | Status: DC | PRN

## 2013-12-18 MED ORDER — SODIUM CHLORIDE 0.9 % IV SOLN
INTRAVENOUS | Status: AC
  Administered 2013-12-18: 1 [IU]/h via INTRAVENOUS
  Filled 2013-12-18: qty 1

## 2013-12-18 MED ORDER — PROTAMINE SULFATE 10 MG/ML IV SOLN
INTRAVENOUS | Status: AC
  Filled 2013-12-18: qty 25

## 2013-12-18 MED ORDER — TIROFIBAN HCL IV 5 MG/100ML
0.1500 ug/kg/min | INTRAVENOUS | Status: DC
  Filled 2013-12-18 (×2): qty 100

## 2013-12-18 MED ORDER — DEXMEDETOMIDINE HCL IN NACL 400 MCG/100ML IV SOLN
0.1000 ug/kg/h | INTRAVENOUS | Status: AC
  Administered 2013-12-18: 0.4 ug/kg/h via INTRAVENOUS
  Filled 2013-12-18: qty 100

## 2013-12-18 MED ORDER — ROCURONIUM BROMIDE 100 MG/10ML IV SOLN
INTRAVENOUS | Status: DC | PRN
  Administered 2013-12-18: 50 mg via INTRAVENOUS

## 2013-12-18 MED ORDER — NITROGLYCERIN IN D5W 200-5 MCG/ML-% IV SOLN
2.0000 ug/min | INTRAVENOUS | Status: DC
  Filled 2013-12-18: qty 250

## 2013-12-18 MED ORDER — PAPAVERINE HCL 30 MG/ML IJ SOLN
INTRAMUSCULAR | Status: AC
  Administered 2013-12-18: 13:00:00
  Filled 2013-12-18: qty 2.5

## 2013-12-18 MED ORDER — MIDAZOLAM HCL 2 MG/2ML IJ SOLN
2.0000 mg | INTRAMUSCULAR | Status: DC | PRN
  Administered 2013-12-18: 2 mg via INTRAVENOUS
  Filled 2013-12-18: qty 2

## 2013-12-18 MED ORDER — MORPHINE SULFATE 4 MG/ML IJ SOLN
4.0000 mg | Freq: Once | INTRAMUSCULAR | Status: AC
  Administered 2013-12-18: 4 mg via INTRAVENOUS

## 2013-12-18 MED ORDER — CHLORHEXIDINE GLUCONATE CLOTH 2 % EX PADS
6.0000 | MEDICATED_PAD | Freq: Every day | CUTANEOUS | Status: AC
  Administered 2013-12-18 – 2013-12-22 (×4): 6 via TOPICAL

## 2013-12-18 MED ORDER — HEPARIN (PORCINE) IN NACL 2-0.9 UNIT/ML-% IJ SOLN
INTRAMUSCULAR | Status: AC
  Filled 2013-12-18: qty 1500

## 2013-12-18 MED ORDER — PROTAMINE SULFATE 10 MG/ML IV SOLN
INTRAVENOUS | Status: DC | PRN
  Administered 2013-12-18: 350 mg via INTRAVENOUS

## 2013-12-18 MED ORDER — EPINEPHRINE HCL 1 MG/ML IJ SOLN
0.5000 ug/min | INTRAVENOUS | Status: DC
  Filled 2013-12-18: qty 4

## 2013-12-18 MED ORDER — ACETAMINOPHEN 500 MG PO TABS
1000.0000 mg | ORAL_TABLET | Freq: Four times a day (QID) | ORAL | Status: DC
  Administered 2013-12-19 – 2013-12-20 (×7): 1000 mg via ORAL
  Filled 2013-12-18 (×11): qty 2

## 2013-12-18 MED ORDER — ASPIRIN EC 81 MG PO TBEC: 81.0000 mg | DELAYED_RELEASE_TABLET | Freq: Every day | ORAL | Status: DC

## 2013-12-18 MED ORDER — TIROFIBAN HCL IV 5 MG/100ML
0.1500 ug/kg/min | INTRAVENOUS | Status: DC
  Filled 2013-12-18 (×3): qty 100

## 2013-12-18 MED ORDER — MORPHINE SULFATE 2 MG/ML IJ SOLN: 2.0000 mg | INTRAMUSCULAR | Status: DC | PRN

## 2013-12-18 MED ORDER — PNEUMOCOCCAL VAC POLYVALENT 25 MCG/0.5ML IJ INJ: 0.5000 mL | INJECTION | INTRAMUSCULAR | Status: DC | PRN

## 2013-12-18 MED ORDER — PANTOPRAZOLE SODIUM 40 MG PO TBEC
40.0000 mg | DELAYED_RELEASE_TABLET | Freq: Every day | ORAL | Status: DC
  Administered 2013-12-20: 40 mg via ORAL
  Filled 2013-12-18: qty 1

## 2013-12-18 MED ORDER — LIDOCAINE HCL (CARDIAC) 20 MG/ML IV SOLN
INTRAVENOUS | Status: AC
  Filled 2013-12-18: qty 5

## 2013-12-18 MED ORDER — ASPIRIN 81 MG PO CHEW: 324.0000 mg | CHEWABLE_TABLET | Freq: Once | ORAL | Status: AC

## 2013-12-18 MED ORDER — METOPROLOL TARTRATE 25 MG/10 ML ORAL SUSPENSION
12.5000 mg | Freq: Two times a day (BID) | ORAL | Status: DC
  Filled 2013-12-18 (×5): qty 5

## 2013-12-18 MED ORDER — SODIUM CHLORIDE 0.9 % IV SOLN: 1.0000 mL/kg/h | INTRAVENOUS | Status: DC

## 2013-12-18 MED ORDER — SODIUM CHLORIDE 0.9 % IJ SOLN: 3.0000 mL | Freq: Two times a day (BID) | INTRAMUSCULAR | Status: DC

## 2013-12-18 MED ORDER — HEMOSTATIC AGENTS (NO CHARGE) OPTIME
TOPICAL | Status: DC | PRN
  Administered 2013-12-18: 1 via TOPICAL

## 2013-12-18 MED ORDER — DOCUSATE SODIUM 100 MG PO CAPS
200.0000 mg | ORAL_CAPSULE | Freq: Every day | ORAL | Status: DC
  Administered 2013-12-19 – 2013-12-20 (×2): 200 mg via ORAL
  Filled 2013-12-18 (×2): qty 2

## 2013-12-18 MED ORDER — ARTIFICIAL TEARS OP OINT
TOPICAL_OINTMENT | OPHTHALMIC | Status: DC | PRN
  Administered 2013-12-18: 1 via OPHTHALMIC

## 2013-12-18 MED ORDER — VANCOMYCIN HCL 10 G IV SOLR
1250.0000 mg | INTRAVENOUS | Status: AC
  Administered 2013-12-18: 1250 mg via INTRAVENOUS
  Filled 2013-12-18: qty 1250

## 2013-12-18 MED ORDER — HEPARIN SODIUM (PORCINE) 1000 UNIT/ML IJ SOLN
INTRAMUSCULAR | Status: AC
  Filled 2013-12-18: qty 1

## 2013-12-18 MED ORDER — NITROGLYCERIN IN D5W 200-5 MCG/ML-% IV SOLN
2.0000 ug/min | INTRAVENOUS | Status: AC
  Administered 2013-12-18: 5 ug/min via INTRAVENOUS
  Filled 2013-12-18: qty 250

## 2013-12-18 MED ORDER — BIVALIRUDIN 250 MG IV SOLR
INTRAVENOUS | Status: AC
Start: 2013-12-18 — End: 2013-12-18
  Filled 2013-12-18: qty 250

## 2013-12-18 MED ORDER — MAGNESIUM SULFATE 4000MG/100ML IJ SOLN
4.0000 g | Freq: Once | INTRAMUSCULAR | Status: AC
  Administered 2013-12-18: 4 g via INTRAVENOUS
  Filled 2013-12-18: qty 100

## 2013-12-18 MED ORDER — NITROGLYCERIN IN D5W 200-5 MCG/ML-% IV SOLN: 0.0000 ug/min | INTRAVENOUS | Status: DC

## 2013-12-18 MED ORDER — ASPIRIN 81 MG PO CHEW: 324.0000 mg | CHEWABLE_TABLET | Freq: Every day | ORAL | Status: DC

## 2013-12-18 MED ORDER — LACTATED RINGERS IV SOLN
INTRAVENOUS | Status: DC
  Administered 2013-12-19: 20 mL/h via INTRAVENOUS

## 2013-12-18 MED ORDER — CHLORHEXIDINE GLUCONATE 0.12 % MT SOLN: 15.0000 mL | Freq: Once | OROMUCOSAL | Status: DC

## 2013-12-18 MED ORDER — ROCURONIUM BROMIDE 50 MG/5ML IV SOLN
INTRAVENOUS | Status: AC
  Filled 2013-12-18: qty 1

## 2013-12-18 MED ORDER — ACETAMINOPHEN 160 MG/5ML PO SOLN: 650.0000 mg | Freq: Once | ORAL | Status: AC

## 2013-12-18 MED ORDER — MUPIROCIN 2 % EX OINT
1.0000 "application " | TOPICAL_OINTMENT | Freq: Two times a day (BID) | CUTANEOUS | Status: DC
  Administered 2013-12-19 – 2013-12-23 (×9): 1 via NASAL
  Filled 2013-12-18 (×2): qty 22

## 2013-12-18 MED ORDER — CHLORHEXIDINE GLUCONATE 4 % EX LIQD
CUTANEOUS | Status: AC
  Administered 2013-12-18: 09:00:00
  Filled 2013-12-18: qty 15

## 2013-12-18 MED ORDER — SODIUM CHLORIDE 0.9 % IV SOLN: 250.0000 mL | INTRAVENOUS | Status: DC | PRN

## 2013-12-18 MED ORDER — DEXMEDETOMIDINE HCL IN NACL 400 MCG/100ML IV SOLN
0.1000 ug/kg/h | INTRAVENOUS | Status: DC
  Filled 2013-12-18: qty 100

## 2013-12-18 MED ORDER — ASPIRIN 81 MG PO CHEW
324.0000 mg | CHEWABLE_TABLET | Freq: Once | ORAL | Status: AC
  Administered 2013-12-18: 324 mg via ORAL

## 2013-12-18 MED ORDER — MIDAZOLAM HCL 10 MG/2ML IJ SOLN
INTRAMUSCULAR | Status: AC
  Filled 2013-12-18: qty 2

## 2013-12-18 MED ORDER — ACETAMINOPHEN 160 MG/5ML PO SOLN
1000.0000 mg | Freq: Four times a day (QID) | ORAL | Status: DC
  Filled 2013-12-18: qty 40

## 2013-12-18 MED ORDER — LACTATED RINGERS IV SOLN
INTRAVENOUS | Status: DC | PRN
  Administered 2013-12-18 (×6): via INTRAVENOUS

## 2013-12-18 MED ORDER — PLASMA-LYTE 148 IV SOLN
INTRAVENOUS | Status: DC
  Filled 2013-12-18: qty 2.5

## 2013-12-18 MED ORDER — TIROFIBAN HCL IV 12.5 MG/250 ML
INTRAVENOUS | Status: AC
Start: 2013-12-18 — End: 2013-12-18
  Filled 2013-12-18: qty 250

## 2013-12-18 MED ORDER — PERFLUTREN LIPID MICROSPHERE
1.0000 mL | INTRAVENOUS | Status: DC | PRN
  Administered 2013-12-18: 2 mL via INTRAVENOUS
  Filled 2013-12-18: qty 10

## 2013-12-18 MED ORDER — BISACODYL 5 MG PO TBEC
10.0000 mg | DELAYED_RELEASE_TABLET | Freq: Every day | ORAL | Status: DC
  Administered 2013-12-19 – 2013-12-20 (×2): 10 mg via ORAL
  Filled 2013-12-18 (×2): qty 2

## 2013-12-18 MED ORDER — ARTIFICIAL TEARS OP OINT
TOPICAL_OINTMENT | OPHTHALMIC | Status: AC
  Filled 2013-12-18: qty 3.5

## 2013-12-18 MED ORDER — INSULIN ASPART 100 UNIT/ML ~~LOC~~ SOLN
0.0000 [IU] | SUBCUTANEOUS | Status: DC
  Administered 2013-12-19: 2 [IU] via SUBCUTANEOUS

## 2013-12-18 MED ORDER — SODIUM CHLORIDE 0.9 % IV SOLN
INTRAVENOUS | Status: DC
  Filled 2013-12-18: qty 30

## 2013-12-18 MED ORDER — HEPARIN (PORCINE) IN NACL 100-0.45 UNIT/ML-% IJ SOLN
1500.0000 [IU]/h | INTRAMUSCULAR | Status: DC
  Filled 2013-12-18 (×2): qty 250

## 2013-12-18 MED ORDER — PERFLUTREN LIPID MICROSPHERE
INTRAVENOUS | Status: AC
  Filled 2013-12-18: qty 10

## 2013-12-18 MED ORDER — FAMOTIDINE IN NACL 20-0.9 MG/50ML-% IV SOLN
20.0000 mg | Freq: Two times a day (BID) | INTRAVENOUS | Status: AC
  Administered 2013-12-18 – 2013-12-19 (×2): 20 mg via INTRAVENOUS
  Filled 2013-12-18: qty 50

## 2013-12-18 MED ORDER — CALCIUM CHLORIDE 10 % IV SOLN
INTRAVENOUS | Status: DC | PRN
  Administered 2013-12-18: 100 mg via INTRAVENOUS
  Administered 2013-12-18 (×2): 200 mg via INTRAVENOUS
  Administered 2013-12-18: 100 mg via INTRAVENOUS

## 2013-12-18 MED ORDER — SODIUM CHLORIDE 0.9 % IV SOLN
INTRAVENOUS | Status: DC
  Administered 2013-12-18 (×2): via INTRAVENOUS

## 2013-12-18 MED ORDER — SODIUM CHLORIDE 0.9 % IV SOLN
INTRAVENOUS | Status: AC
  Administered 2013-12-18: 70 mL/h via INTRAVENOUS
  Filled 2013-12-18: qty 40

## 2013-12-18 MED ORDER — DEXTROSE 5 % IV SOLN
1.5000 g | INTRAVENOUS | Status: DC
  Filled 2013-12-18: qty 1.5

## 2013-12-18 MED ORDER — CALCIUM CHLORIDE 10 % IV SOLN
INTRAVENOUS | Status: AC
  Filled 2013-12-18: qty 10

## 2013-12-18 MED ORDER — NITROGLYCERIN 0.4 MG SL SUBL
SUBLINGUAL_TABLET | SUBLINGUAL | Status: AC
  Administered 2013-12-18: 0.4 mg via SUBLINGUAL
  Filled 2013-12-18: qty 1

## 2013-12-18 MED ORDER — ALBUMIN HUMAN 5 % IV SOLN
INTRAVENOUS | Status: DC | PRN
  Administered 2013-12-18: 18:00:00 via INTRAVENOUS

## 2013-12-18 MED ORDER — SODIUM CHLORIDE 0.9 % IV SOLN: 250.0000 mL | INTRAVENOUS | Status: DC

## 2013-12-18 MED ORDER — SODIUM CHLORIDE 0.9 % IJ SOLN
3.0000 mL | Freq: Two times a day (BID) | INTRAMUSCULAR | Status: DC
Start: 2013-12-19 — End: 2013-12-20
  Administered 2013-12-19 – 2013-12-20 (×3): 3 mL via INTRAVENOUS

## 2013-12-18 MED ORDER — SODIUM CHLORIDE 0.9 % IV SOLN
INTRAVENOUS | Status: DC
  Filled 2013-12-18: qty 40

## 2013-12-18 MED ORDER — ONDANSETRON HCL 4 MG/2ML IJ SOLN
4.0000 mg | Freq: Once | INTRAMUSCULAR | Status: AC
  Administered 2013-12-18: 4 mg via INTRAVENOUS
  Filled 2013-12-18: qty 2

## 2013-12-18 MED ORDER — PHENYLEPHRINE HCL 10 MG/ML IJ SOLN
30.0000 ug/min | INTRAVENOUS | Status: DC
  Filled 2013-12-18: qty 2

## 2013-12-18 MED ORDER — DOPAMINE-DEXTROSE 3.2-5 MG/ML-% IV SOLN
2.0000 ug/kg/min | INTRAVENOUS | Status: AC
  Administered 2013-12-18: 3 ug/kg/min via INTRAVENOUS
  Filled 2013-12-18: qty 250

## 2013-12-18 MED ORDER — LIDOCAINE HCL (PF) 1 % IJ SOLN
INTRAMUSCULAR | Status: AC
Start: 2013-12-18 — End: 2013-12-18
  Filled 2013-12-18: qty 30

## 2013-12-18 MED ORDER — FENTANYL CITRATE 0.05 MG/ML IJ SOLN
INTRAMUSCULAR | Status: AC
  Filled 2013-12-18: qty 2

## 2013-12-18 MED ORDER — HEPARIN SODIUM (PORCINE) 5000 UNIT/ML IJ SOLN
4000.0000 [IU] | INTRAMUSCULAR | Status: AC
  Administered 2013-12-18: 4000 [IU] via INTRAVENOUS
  Filled 2013-12-18: qty 0.8

## 2013-12-18 MED ORDER — SODIUM CHLORIDE 0.45 % IV SOLN
INTRAVENOUS | Status: DC
  Administered 2013-12-18: 20 mL/h via INTRAVENOUS

## 2013-12-18 MED ORDER — VANCOMYCIN HCL IN DEXTROSE 1-5 GM/200ML-% IV SOLN
1000.0000 mg | Freq: Once | INTRAVENOUS | Status: AC
  Administered 2013-12-19: 1000 mg via INTRAVENOUS
  Filled 2013-12-18: qty 200

## 2013-12-18 MED ORDER — OXYCODONE HCL 5 MG PO TABS
5.0000 mg | ORAL_TABLET | ORAL | Status: DC | PRN
  Administered 2013-12-19 – 2013-12-20 (×4): 10 mg via ORAL
  Filled 2013-12-18 (×5): qty 2

## 2013-12-18 MED ORDER — METOPROLOL TARTRATE 12.5 MG HALF TABLET
12.5000 mg | ORAL_TABLET | Freq: Two times a day (BID) | ORAL | Status: DC
  Filled 2013-12-18 (×2): qty 1

## 2013-12-18 MED ORDER — MORPHINE SULFATE 2 MG/ML IJ SOLN
1.0000 mg | INTRAMUSCULAR | Status: AC | PRN
  Administered 2013-12-18: 2 mg via INTRAVENOUS
  Administered 2013-12-18: 4 mg via INTRAVENOUS

## 2013-12-18 MED ORDER — VANCOMYCIN HCL 10 G IV SOLR
1250.0000 mg | INTRAVENOUS | Status: DC
  Filled 2013-12-18: qty 1250

## 2013-12-18 MED ORDER — MORPHINE SULFATE 2 MG/ML IJ SOLN
2.0000 mg | INTRAMUSCULAR | Status: DC | PRN
  Administered 2013-12-18 – 2013-12-19 (×2): 4 mg via INTRAVENOUS
  Administered 2013-12-19: 2 mg via INTRAVENOUS
  Filled 2013-12-18 (×2): qty 2
  Filled 2013-12-18: qty 1
  Filled 2013-12-18: qty 2
  Filled 2013-12-18: qty 1

## 2013-12-18 MED ORDER — PROPOFOL 10 MG/ML IV BOLUS
INTRAVENOUS | Status: DC | PRN
  Administered 2013-12-18: 120 mg via INTRAVENOUS
  Administered 2013-12-18: 250 mg via INTRAVENOUS

## 2013-12-18 MED ORDER — PROTAMINE SULFATE 10 MG/ML IV SOLN
INTRAVENOUS | Status: AC
  Filled 2013-12-18: qty 10

## 2013-12-18 MED ORDER — 0.9 % SODIUM CHLORIDE (POUR BTL) OPTIME
TOPICAL | Status: DC | PRN
  Administered 2013-12-18: 1000 mL

## 2013-12-18 SURGICAL SUPPLY — 104 items
ATTRACTOMAT 16X20 MAGNETIC DRP (DRAPES) ×4 IMPLANT
BAG DECANTER FOR FLEXI CONT (MISCELLANEOUS) ×4 IMPLANT
BANDAGE ELASTIC 4 VELCRO ST LF (GAUZE/BANDAGES/DRESSINGS) ×4 IMPLANT
BANDAGE ELASTIC 6 VELCRO ST LF (GAUZE/BANDAGES/DRESSINGS) ×4 IMPLANT
BANDAGE GAUZE ELAST BULKY 4 IN (GAUZE/BANDAGES/DRESSINGS) ×4 IMPLANT
BASKET HEART  (ORDER IN 25'S) (MISCELLANEOUS) ×1
BASKET HEART (ORDER IN 25'S) (MISCELLANEOUS) ×1
BASKET HEART (ORDER IN 25S) (MISCELLANEOUS) ×2 IMPLANT
BLADE 11 SAFETY STRL DISP (BLADE) ×4 IMPLANT
BLADE STERNUM SYSTEM 6 (BLADE) ×4 IMPLANT
BLADE SURG ROTATE 9660 (MISCELLANEOUS) ×4 IMPLANT
CANISTER SUCTION 2500CC (MISCELLANEOUS) ×4 IMPLANT
CANNULA ARTERIAL NVNT 3/8 22FR (MISCELLANEOUS) ×4 IMPLANT
CARDIAC SUCTION (MISCELLANEOUS) ×4 IMPLANT
CATH ROBINSON RED A/P 18FR (CATHETERS) ×8 IMPLANT
CATH THORACIC 28FR (CATHETERS) ×4 IMPLANT
CATH THORACIC 36FR (CATHETERS) ×4 IMPLANT
CATH THORACIC 36FR RT ANG (CATHETERS) ×4 IMPLANT
CLIP TI MEDIUM 24 (CLIP) IMPLANT
CLIP TI WIDE RED SMALL 24 (CLIP) ×4 IMPLANT
COVER SURGICAL LIGHT HANDLE (MISCELLANEOUS) ×4 IMPLANT
CRADLE DONUT ADULT HEAD (MISCELLANEOUS) ×4 IMPLANT
DRAPE CARDIOVASCULAR INCISE (DRAPES) ×4
DRAPE SLUSH/WARMER DISC (DRAPES) ×4 IMPLANT
DRAPE SRG 135X102X78XABS (DRAPES) ×2 IMPLANT
DRSG COVADERM 4X14 (GAUZE/BANDAGES/DRESSINGS) ×4 IMPLANT
ELECT CAUTERY BLADE 6.4 (BLADE) ×4 IMPLANT
ELECT REM PT RETURN 9FT ADLT (ELECTROSURGICAL) ×8
ELECTRODE REM PT RTRN 9FT ADLT (ELECTROSURGICAL) ×4 IMPLANT
GLOVE BIO SURGEON STRL SZ 6 (GLOVE) ×24 IMPLANT
GLOVE BIO SURGEON STRL SZ 6.5 (GLOVE) ×21 IMPLANT
GLOVE BIO SURGEON STRL SZ7 (GLOVE) IMPLANT
GLOVE BIO SURGEON STRL SZ7.5 (GLOVE) IMPLANT
GLOVE BIO SURGEONS STRL SZ 6.5 (GLOVE) ×7
GLOVE BIOGEL PI IND STRL 6 (GLOVE) IMPLANT
GLOVE BIOGEL PI IND STRL 6.5 (GLOVE) ×16 IMPLANT
GLOVE BIOGEL PI IND STRL 7.0 (GLOVE) IMPLANT
GLOVE BIOGEL PI INDICATOR 6 (GLOVE)
GLOVE BIOGEL PI INDICATOR 6.5 (GLOVE) ×16
GLOVE BIOGEL PI INDICATOR 7.0 (GLOVE)
GLOVE EUDERMIC 7 POWDERFREE (GLOVE) ×8 IMPLANT
GLOVE ORTHO TXT STRL SZ7.5 (GLOVE) IMPLANT
GOWN STRL REUS W/ TWL LRG LVL3 (GOWN DISPOSABLE) ×20 IMPLANT
GOWN STRL REUS W/ TWL XL LVL3 (GOWN DISPOSABLE) ×2 IMPLANT
GOWN STRL REUS W/TWL LRG LVL3 (GOWN DISPOSABLE) ×40
GOWN STRL REUS W/TWL XL LVL3 (GOWN DISPOSABLE) ×4
HEMOSTAT POWDER SURGIFOAM 1G (HEMOSTASIS) ×12 IMPLANT
HEMOSTAT SURGICEL 2X14 (HEMOSTASIS) ×4 IMPLANT
INSERT FOGARTY 61MM (MISCELLANEOUS) IMPLANT
INSERT FOGARTY XLG (MISCELLANEOUS) IMPLANT
KIT BASIN OR (CUSTOM PROCEDURE TRAY) ×4 IMPLANT
KIT CATH CPB BARTLE (MISCELLANEOUS) ×4 IMPLANT
KIT ROOM TURNOVER OR (KITS) ×4 IMPLANT
KIT SUCTION CATH 14FR (SUCTIONS) ×4 IMPLANT
KIT VASOVIEW W/TROCAR VH 2000 (KITS) ×4 IMPLANT
NS IRRIG 1000ML POUR BTL (IV SOLUTION) ×24 IMPLANT
PACK OPEN HEART (CUSTOM PROCEDURE TRAY) ×4 IMPLANT
PAD ARMBOARD 7.5X6 YLW CONV (MISCELLANEOUS) ×8 IMPLANT
PAD ELECT DEFIB RADIOL ZOLL (MISCELLANEOUS) ×4 IMPLANT
PENCIL BUTTON HOLSTER BLD 10FT (ELECTRODE) ×4 IMPLANT
PUNCH AORTIC ROTATE 4.0MM (MISCELLANEOUS) IMPLANT
PUNCH AORTIC ROTATE 4.5MM 8IN (MISCELLANEOUS) ×4 IMPLANT
PUNCH AORTIC ROTATE 5MM 8IN (MISCELLANEOUS) IMPLANT
SET CARDIOPLEGIA MPS 5001102 (MISCELLANEOUS) ×4 IMPLANT
SOLUTION ANTI FOG 6CC (MISCELLANEOUS) ×4 IMPLANT
SPONGE GAUZE 4X4 12PLY (GAUZE/BANDAGES/DRESSINGS) ×8 IMPLANT
SPONGE INTESTINAL PEANUT (DISPOSABLE) IMPLANT
SPONGE LAP 18X18 X RAY DECT (DISPOSABLE) ×4 IMPLANT
SPONGE LAP 4X18 X RAY DECT (DISPOSABLE) ×4 IMPLANT
SUT BONE WAX W31G (SUTURE) ×4 IMPLANT
SUT MNCRL AB 4-0 PS2 18 (SUTURE) IMPLANT
SUT PROLENE 3 0 SH DA (SUTURE) IMPLANT
SUT PROLENE 3 0 SH1 36 (SUTURE) ×4 IMPLANT
SUT PROLENE 4 0 RB 1 (SUTURE)
SUT PROLENE 4 0 SH DA (SUTURE) IMPLANT
SUT PROLENE 4-0 RB1 .5 CRCL 36 (SUTURE) IMPLANT
SUT PROLENE 5 0 C 1 36 (SUTURE) IMPLANT
SUT PROLENE 6 0 C 1 30 (SUTURE) ×4 IMPLANT
SUT PROLENE 7 0 BV 1 (SUTURE) IMPLANT
SUT PROLENE 7 0 BV1 MDA (SUTURE) ×8 IMPLANT
SUT PROLENE 8 0 BV175 6 (SUTURE) IMPLANT
SUT SILK  1 MH (SUTURE)
SUT SILK 1 MH (SUTURE) IMPLANT
SUT STEEL STERNAL CCS#1 18IN (SUTURE) IMPLANT
SUT STEEL SZ 6 DBL 3X14 BALL (SUTURE) ×12 IMPLANT
SUT VIC AB 1 CTX 36 (SUTURE) ×8
SUT VIC AB 1 CTX36XBRD ANBCTR (SUTURE) ×4 IMPLANT
SUT VIC AB 2-0 CT1 27 (SUTURE) ×4
SUT VIC AB 2-0 CT1 TAPERPNT 27 (SUTURE) ×2 IMPLANT
SUT VIC AB 2-0 CTX 27 (SUTURE) IMPLANT
SUT VIC AB 3-0 SH 27 (SUTURE)
SUT VIC AB 3-0 SH 27X BRD (SUTURE) IMPLANT
SUT VIC AB 3-0 X1 27 (SUTURE) IMPLANT
SUT VICRYL 4-0 PS2 18IN ABS (SUTURE) ×4 IMPLANT
SUTURE E-PAK OPEN HEART (SUTURE) ×4 IMPLANT
SYSTEM SAHARA CHEST DRAIN ATS (WOUND CARE) ×4 IMPLANT
TAPE CLOTH SURG 4X10 WHT LF (GAUZE/BANDAGES/DRESSINGS) ×4 IMPLANT
TOWEL OR 17X24 6PK STRL BLUE (TOWEL DISPOSABLE) ×4 IMPLANT
TOWEL OR 17X26 10 PK STRL BLUE (TOWEL DISPOSABLE) ×4 IMPLANT
TOWEL OR NON WOVEN STRL DISP B (DISPOSABLE) ×4 IMPLANT
TRAY FOLEY IC TEMP SENS 16FR (CATHETERS) ×4 IMPLANT
TUBING INSUFFLATION 10FT LAP (TUBING) ×4 IMPLANT
UNDERPAD 30X30 INCONTINENT (UNDERPADS AND DIAPERS) ×4 IMPLANT
WATER STERILE IRR 1000ML POUR (IV SOLUTION) ×8 IMPLANT

## 2013-12-18 NOTE — Progress Notes (Signed)
  Echocardiogram Echocardiogram Transesophageal has been performed.  Arvil Chaco 12/18/2013, 3:30 PM

## 2013-12-18 NOTE — Progress Notes (Addendum)
VASCULAR LAB PRELIMINARY  PRELIMINARY  PRELIMINARY  PRELIMINARY  Pre-op Cardiac Surgery  Carotid Findings:  Right - 40% to 59% ICA stenosis. Vertebral artery flow is antegrade. Left - 1% to 39% ICA stenosis. Vertebral artery flow is antegrade.  Upper Extremity Right Left  Brachial Pressures 115 Triphasic 126 Triphasic  Radial Waveforms Triphasic Triphasic  Ulnar Waveforms Triphasic Triphasic  Palmar Arch (Allen's Test) Normla Abnormal   Findings:  Doppler waveforms remained normal on the right with both radial and ulnar compressions. Left Doppler waveforms remained normal with radial compression and diminished greater than 50% with ulnar compression.    Lower  Extremity Right Left  Dorsalis Pedis    Anterior Tibial    Posterior Tibial    Ankle/Brachial Indices      Findings:  Palpable pulses bilaterally   Lee May, RVS 12/18/2013, 9:12 AM

## 2013-12-18 NOTE — ED Notes (Signed)
To cath lab.

## 2013-12-18 NOTE — Progress Notes (Signed)
Utilization review completed. Glayds Insco, RN, BSN. 

## 2013-12-18 NOTE — ED Notes (Signed)
Patient arrived from [facility] via [facility]

## 2013-12-18 NOTE — H&P (Signed)
Admit date: 12/18/2013 Referring Physician:  Dr. Hyacinth Meeker Primary Cardiologist:  NONE Chief complaint/reason for admission: Chest pain  HPI: This is a 53yo WM with a history of CAD s/p remote MI with PCI of unknown vessel about 6 years ago in [place].  He has not followed up with a Cardiologist since.  He says that he probably has HTN and dyslipidemia but has not been taking any medications.  He was in his USOH until around MN while trying to go to sleep he developed severe 10/10 sharp CP with right arm numbness.  He got diaphoretic and nauseated.  He said it was worse supine and improved sitting up.  The pain persisted and he decided to come to the ER.  In the ER he was noted to have some mild ST segment elevation in the anterolateral leads and Troponin was elevated at 1.17.  He was given SL NTG with some improvement in his CP and now rates it a 6/10.     PMH:    Past Medical History  Diagnosis Date  . MI (myocardial infarction)   . CAD (coronary artery disease)   . Hypertension     PSH:    Past Surgical History  Procedure Laterality Date  . Coronary angioplasty with stent placement    . Fracture surgery      ALLERGIES:   Review of patient's allergies indicates no known allergies.  Prior to Admit Meds:   (Not in a hospital admission) Family HX:    Family History  Problem Relation Age of Onset  . Cancer Other   . CAD Other   . Cancer Father    Social HX:    History   Social History  . Marital Status: Divorced    Spouse Name: N/A    Number of Children: N/A  . Years of Education: N/A   Occupational History  . Not on file.   Social History Main Topics  . Smoking status: Current Every Day Smoker -- 3.00 packs/day  . Smokeless tobacco: Not on file  . Alcohol Use: No  . Drug Use: No  . Sexual Activity: Not on file   Other Topics Concern  . Not on file   Social History Narrative  . No narrative on file     ROS:  All 11 ROS were addressed and are negative except what is  stated in the HPI  PHYSICAL EXAM Filed Vitals:   12/18/13 0401  BP: 142/94  Pulse: 76  Temp: 97.8 F (36.6 C)  Resp: 25   General: Well developed, well nourished, in no acute distress Head: Eyes PERRLA, No xanthomas.   Normal cephalic and atramatic  Lungs:   Clear bilaterally to auscultation and percussion. Heart:   HRRR S1 S2 Pulses are 2+ & equal.            No carotid bruit. No JVD.  No abdominal bruits. No femoral bruits. Abdomen: Bowel sounds are positive, abdomen soft and non-tender without masses  Extremities:   No clubbing, cyanosis or edema.  DP +1 Neuro: Alert and oriented X 3. Psych:  Good affect, responds appropriately   Labs:   Lab Results  Component Value Date   WBC 16.3* 12/18/2013   HGB 18.0* 12/18/2013   HCT 51.4 12/18/2013   MCV 87.7 12/18/2013   PLT 322 12/18/2013   No results found for this basename: NA, K, CL, CO2, BUN, CREATININE, CALCIUM, LABALBU, PROT, BILITOT, ALKPHOS, ALT, AST, GLUCOSE,  in the last 168 hours Lab  Results  Component Value Date   TROPONINI <0.30 09/08/2012   No results found for this basename: PTT   Lab Results  Component Value Date   INR 0.99 12/18/2013     No results found for this basename: CHOL   No results found for this basename: HDL   No results found for this basename: LDLCALC   No results found for this basename: TRIG   No results found for this basename: CHOLHDL   No results found for this basename: LDLDIRECT      Radiology:  No results found.  EKG:  NSR with ST elevation in the anterolateral leads  ASSESSMENT:  1.  Anterolateral STEMI - CP somewhat atypical in that it is sharp and worse lying supine but did improve with NTG and there is mild ST elevation on EKG.  Troponin elevated at 1.17. 2.  ASCAD with remote MI and PCI - he has not followed up with any MD since then 3.  Dyslipidemia - he has not been taking any med or had this followed 4.  Tobacco abuse ongoing  PLAN:   1.  Admit to CCU 2.  Emergent  cath per Dr. Herbie BaltimoreHarding 3.  IV Heparin per pharmacy 4.  ASA given in ER 5.  Lopressor 12.5mg  BID 6.  Lipitor 40mg  daily 7.  Check FLP and HbA1C in am 8.  Tobacco abuse cessation counseling 9.  2D echo in am to assess LVF.  Quintella ReichertURNER,Adamariz Gillott R, MD  12/18/2013  4:55 AM

## 2013-12-18 NOTE — ED Notes (Signed)
Pt states he started having chest pain about midnight  Pt describes pain as sharp in nature with nausea, chills, and some shortness of breath  Pt has hx of CAD with stent placement and heart attack

## 2013-12-18 NOTE — Progress Notes (Addendum)
  Echocardiogram 2D Echocardiogram with Definity has been performed.  Jany Buckwalter 12/18/2013, 10:11 AM

## 2013-12-18 NOTE — ED Provider Notes (Signed)
CSN: [id number]     Arrival date & time 12/18/13  0353 History   First MD Initiated Contact with Patient 12/18/13 0413     Chief Complaint  Patient presents with  . Chest Pain     (Consider location/radiation/quality/duration/timing/severity/associated sxs/prior Treatment) HPI Comments: 53 y/o male with hx of Htn, has hx of tob use 3ppd, and [place] - does not use ASA, no other anticoag and not using meds for htn.  He describes the onset of a sharp chest pain in his mid chest that started approximately 4 hours ago, was acute in onset and has radiation of the right arm which he states went numb. On arrival the patient states that his pain worsened he became diaphoretic. He denies swelling of the lower extremities. Symptoms are severe and persistent and gradually worsening  Patient is a 53 y.o. male presenting with chest pain. The history is provided by the patient.  Chest Pain   Past Medical History  Diagnosis Date  . MI (myocardial infarction)   . CAD (coronary artery disease)   . Hypertension    Past Surgical History  Procedure Laterality Date  . Coronary angioplasty with stent placement    . Fracture surgery     Family History  Problem Relation Age of Onset  . Cancer Other   . CAD Other    History  Substance Use Topics  . Smoking status: Current Every Day Smoker -- 3.00 packs/day  . Smokeless tobacco: Not on file  . Alcohol Use: No    Review of Systems  Cardiovascular: Positive for chest pain.  All other systems reviewed and are negative.     Allergies  Review of patient's allergies indicates no known allergies.  Home Medications   Prior to Admission medications   Not on File   BP 142/94  Pulse 76  Temp(Src) 97.8 F (36.6 C) (Oral)  Resp 25  Ht 6' (1.829 m)  Wt 230 lb (104.327 kg)  BMI 31.19 kg/m2  SpO2 98% Physical Exam  Nursing note and vitals reviewed. Constitutional: He appears well-developed and  well-nourished. He appears distressed.  HENT:  Head: Normocephalic and atraumatic.  Mouth/Throat: Oropharynx is clear and moist. No oropharyngeal exudate.  Eyes: Conjunctivae and EOM are normal. Pupils are equal, round, and reactive to light. Right eye exhibits no discharge. Left eye exhibits no discharge. No scleral icterus.  Neck: Normal range of motion. Neck supple. No JVD present. No thyromegaly present.  Cardiovascular: Normal rate, regular rhythm, normal heart sounds and intact distal pulses.  Exam reveals no gallop and no friction rub.   No murmur heard. Pulmonary/Chest: Effort normal and breath sounds normal. No respiratory distress. He has no wheezes. He has no rales.  Abdominal: Soft. Bowel sounds are normal. He exhibits no distension and no mass. There is no tenderness.  Musculoskeletal: Normal range of motion. He exhibits no edema and no tenderness.  Lymphadenopathy:    He has no cervical adenopathy.  Neurological: He is alert. Coordination normal.  Skin: Skin is warm. No rash noted. He is diaphoretic. No erythema.  Psychiatric: He has a normal mood and affect. His behavior is normal.    ED Course  Procedures (including critical care time) Labs Review Labs Reviewed  APTT  CBC  COMPREHENSIVE METABOLIC PANEL  PROTIME-INR  I-STAT TROPOININ, ED    Imaging Review No results found.  ED ECG REPORT  I personally interpreted this EKG   Date:  12/18/2013   Rate: 77  Rhythm: normal sinus rhythm  QRS Axis: normal  Intervals: normal  ST/T Wave abnormalities: ST elevations anteriorly  Conduction Disutrbances:none  Narrative Interpretation:   Old EKG Reviewed: changes noted - now with ST elevations in the anterior segments, poor R wave progression versus Q waves, not seen on prior EKGs   MDM   Final diagnoses:  ST elevation myocardial infarction (STEMI), unspecified artery  Secondary hypertension, unspecified    The EKG is suggestive of ST elevation MI with elevation in  leads V2, V3, V4. STEMI activated on arrival, heparin, nitroglycerin, aspirin, cardiac monitoring. We'll discuss with cardiology.  STEMI activated both due to EKG in the patient's condition with history of noncompliance with medications and ongoing symptoms.  Morphine  Heparin  NItro  Transport immediately to [facility] - accepted by Cardiology Dr. Herbie Baltimore  CRITICAL CARE Performed by: Vida Roller Total critical care time: 35 Critical care time was exclusive of separately billable procedures and treating other patients. Critical care was necessary to treat or prevent imminent or life-threatening deterioration. Critical care was time spent personally by me on the following activities: development of treatment plan with patient and/or surrogate as well as nursing, discussions with consultants, evaluation of patient's response to treatment, examination of patient, obtaining history from patient or surrogate, ordering and performing treatments and interventions, ordering and review of laboratory studies, ordering and review of radiographic studies, pulse oximetry and re-evaluation of patient's condition.   Vida Roller, MD 12/18/13 [phone]

## 2013-12-18 NOTE — Progress Notes (Signed)
Patient ID: Lee May, male   DOB: [birth year], 53 y.o.   MRN: [id number]    Subjective:  Denies SSCP, palpitations or Dyspnea   Objective:  Filed Vitals:   12/18/13 0715 12/18/13 0730 12/18/13 0745 12/18/13 0800  BP: 132/86 135/93 136/100 136/96  Pulse: 119 76 76 75  Temp:      TempSrc:      Resp: 22 11 17 23   Height:      Weight:      SpO2: 100% 100% 100% 100%    Intake/Output from previous day:  Intake/Output Summary (Last 24 hours) at 12/18/13 0914 Last data filed at 12/18/13 0900  Gross per 24 hour  Intake 218.12 ml  Output    750 ml  Net -531.88 ml    Physical Exam: Affect appropriate Healthy:  appears stated age HEENT: normal Neck supple with no adenopathy JVP normal no bruits no thyromegaly Lungs clear with no wheezing and good diaphragmatic motion Heart:  S1/S2 no murmur, no rub, gallop or click PMI normal Abdomen: benighn, BS positve, no tenderness, no AAA no bruit.  No HSM or HJR Distal pulses intact with no bruits No edema Neuro non-focal Skin warm and dry No muscular weakness   Lab Results: Basic Metabolic Panel:  Recent Labs  40/98/1107/16/15 0420  NA 137  K 4.1  CL 98  CO2 22  GLUCOSE 121*  BUN 12  CREATININE 0.98  CALCIUM 9.8   Liver Function Tests:  Recent Labs  12/18/13 0420  AST 33  ALT 17  ALKPHOS 117  BILITOT 0.5  PROT 8.7*  ALBUMIN 4.1   CBC:  Recent Labs  12/18/13 0420 12/18/13 0818  WBC 16.3* 11.8*  NEUTROABS  --  10.0*  HGB 18.0* 16.3  HCT 51.4 47.9  MCV 87.7 89.7  PLT 322 266    Imaging: Dg Chest Port 1 View  12/18/2013   CLINICAL DATA:  Acute onset chest pain.  EXAM: PORTABLE CHEST - 1 VIEW  COMPARISON:  Chest radiograph September 08, 2012  FINDINGS: Cardiomediastinal silhouette is unremarkable for this low inspiratory portable examination with crowded vasculature markings. Central pulmonary vascular congestion. Similar mild chronic interstitial changes. The lungs are clear without pleural effusions or  focal consolidations. Trachea projects midline and there is no pneumothorax. Included soft tissue planes and osseous structures are non-suspicious.  IMPRESSION: Central pulmonary vascular congestion superimposed on mild chronic interstitial changes.   Electronically Signed   By: Awilda Metroourtnay  Bloomer   On: 12/18/2013 04:33    Cardiac Studies:  ECG:    Telemetry:  NSR no VT   Echo:   Medications:   . aminocaproic acid (AMICAR) for OHS   Intravenous To OR  . [START ON 12/19/2013] aspirin EC  81 mg Oral Daily  . atorvastatin  40 mg Oral q1800  . bisacodyl  5 mg Oral Once  . cefUROXime (ZINACEF)  IV  1.5 g Intravenous To OR  . cefUROXime (ZINACEF)  IV  750 mg Intravenous To OR  . chlorhexidine      . Chlorhexidine Gluconate Cloth  6 each Topical Once  . dexmedetomidine  0.1-0.7 mcg/kg/hr Intravenous To OR  . DOPamine  2-20 mcg/kg/min Intravenous To OR  . epinephrine  0.5-20 mcg/min Intravenous To OR  . heparin-papaverine-plasmalyte irrigation   Irrigation To OR  . heparin 30,000 units/NS 1000 mL solution for CELLSAVER   Other To OR  . insulin (NOVOLIN-R) infusion   Intravenous To OR  . magnesium sulfate  40 mEq Other  To OR  . metoprolol tartrate  12.5 mg Oral BID  . metoprolol tartrate  12.5 mg Oral Once  . nitroGLYCERIN  2-200 mcg/min Intravenous To OR  . phenylephrine (NEO-SYNEPHRINE) Adult infusion  30-200 mcg/min Intravenous To OR  . potassium chloride  80 mEq Other To OR  . sodium chloride  3 mL Intravenous Q12H  . vancomycin  1,250 mg Intravenous To OR     . sodium chloride Stopped (12/18/13 0630)  . sodium chloride 1 mL/kg/hr (12/18/13 0630)  . heparin    . tirofiban Stopped (12/18/13 0710)    Assessment/Plan:  CAD:  3VD  With POB of LAD as culprit.  Dr Laneta Simmers plans OR this afternoon  integrilin d/c  Preliminary carotids reviewed and 40-59% LICA Stable hemodynamics and no angina at this time  All patients questions answered  Mother coming to hospital soon  No other family  here now  Charlton Haws 12/18/2013, 9:14 AM

## 2013-12-18 NOTE — OR Nursing (Signed)
First call to SICU charge nurse at [phone]

## 2013-12-18 NOTE — OR Nursing (Signed)
Second cal to charge nurse SICU [phone]

## 2013-12-18 NOTE — Interval H&P Note (Signed)
History and Physical Interval Note:  12/18/2013 6:08 AM  Lee May  has presented today for surgery, with the diagnosis of STEMI  The various methods of treatment have been discussed with the patient and family. After consideration of risks, benefits and other options for treatment, the patient has consented to  Procedure(s): LEFT HEART CATHETERIZATION WITH CORONARY ANGIOGRAM (N/A) as a surgical intervention .  The patient's history has been reviewed, patient examined, no change in status, stable for surgery.  I have reviewed the patient's chart and labs.  Questions were answered to the patient's satisfaction.     [name] W

## 2013-12-18 NOTE — Progress Notes (Signed)
Patient ID: Lee May, male   DOB: 1960/07/23, 53 y.o.   MRN: [id number] EVENING ROUNDS NOTE :     [address]       [place] [address]             [phone]                 Day of Surgery Procedure(s) (LRB): CORONARY ARTERY BYPASS GRAFTING (CABG) (N/A) INTRAOPERATIVE TRANSESOPHAGEAL ECHOCARDIOGRAM (N/A)  Total Length of Stay:  LOS: 0 days  BP 97/65  Pulse 86  Temp(Src) 99.3 F (37.4 C) (Oral)  Resp 28  Ht 6' (1.829 m)  Wt 257 lb 4.4 oz (116.7 kg)  BMI 34.89 kg/m2  SpO2 97%  .Intake/Output     07/16 0701 - 07/17 0700   I.V. (mL/kg) 4954.1 (42.5)   Blood 1200   IV Piggyback 750   Total Intake(mL/kg) 6904.1 (59.2)   Urine (mL/kg/hr) 2875 (1.6)   Emesis/NG output 50 (0)   Other 1326 (0.7)   Blood 1800 (1)   Chest Tube 220 (0.1)   Total Output 6271   Net +633.1         . sodium chloride 20 mL/hr at 12/18/13 2000  . sodium chloride 10 mL/hr at 12/18/13 2100  . [START ON 12/19/2013] sodium chloride    . dexmedetomidine 0.1 mcg/kg/hr (12/18/13 2100)  . insulin (NOVOLIN-R) infusion 1 mL/hr at 12/18/13 2100  . lactated ringers 20 mL/hr at 12/18/13 2100  . nitroGLYCERIN Stopped (12/18/13 1924)  . phenylephrine (NEO-SYNEPHRINE) Adult infusion 10 mcg/min (12/18/13 2100)     Lab Results  Component Value Date   WBC 20.7* 12/18/2013   HGB 13.9 12/18/2013   HCT 41.0 12/18/2013   PLT 186 12/18/2013   GLUCOSE 113* 12/18/2013   ALT 19 12/18/2013   AST 53* 12/18/2013   NA 140 12/18/2013   K 3.5* 12/18/2013   CL 101 12/18/2013   CREATININE 0.70 12/18/2013   BUN 7 12/18/2013   CO2 26 12/18/2013   TSH 0.431 12/18/2013   INR 1.33 12/18/2013   HGBA1C 5.6 12/18/2013   Stable post op now off vent Neuro intact On doapamine Not bleeding  Delight Ovens MD  Beeper [phone] Office [phone] 12/18/2013 10:21 PM

## 2013-12-18 NOTE — Progress Notes (Signed)
Brief encounter with patient post cath lab. Chaplain found patient in good spirits, no family present, will follow up as needed.  cdb

## 2013-12-18 NOTE — Progress Notes (Signed)
CRITICAL VALUE ALERT  Critical value received:  Troponin 9.48  Date of notification:  12/18/13  Time of notification:  0915  Critical value read back:Yes.    Nurse who received alert:  Deretha Emory, RN  MD notified (1st page):  Treatment appropriate. Pt s/p cath, scheduled for CABG  Time of first page:    MD notified (2nd page):  Time of second page:  Responding MD:    Time MD responded:

## 2013-12-18 NOTE — Consult Note (Signed)
Cardiothoracic Surgery  Brief consult note:   Medical records and cine reviewed, patient examined.  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 Around midnight he developed acute severe chest pain radiating down right arm, nausea and diaphoresis. In ER he had mild ST elevation in anterolateral leads and troponin of 1.17. Some improvement in pain with SL NTG. Cath showed occluded proximal LAD after D1, 80% OM1, occluded RCA after the RV marginal with left to right collat from the diagonal and OM1. The LAD was opened with PTCA with resolution of his pain. His EF is 45% with inferoapical hypokinesis. Since his anterior wall appears preserved and his RCA is chronically occluded I think it would be best to proceed with CABG this afternoon to prevent the LAD from closing again. Will hold Aggrastat and continue heparin. I discussed the operative procedure with the patient including alternatives, benefits and risks; including but not limited to bleeding, blood transfusion, infection, stroke, myocardial infarction, graft failure, heart block requiring a permanent pacemaker, organ dysfunction, and death.  Awanda Mink understands and agrees to proceed.

## 2013-12-18 NOTE — Brief Op Note (Signed)
12/18/2013  4:15 PM  PATIENT:  Lee May  53 y.o. male  PRE-OPERATIVE DIAGNOSIS:  ischemic vessel disease  POST-OPERATIVE DIAGNOSIS:  CAD  PROCEDURE:  Procedure(s):  CORONARY ARTERY BYPASS GRAFTING x 5 -LIMA to LAD -SVG to OM 1 -SVG to ACUTE MARGINAL -SEQ SVG to PDA, PLB  ENDOSCOPIC SAPHENOUS VEIN HARVEST RIGHT LEG  INTRAOPERATIVE TRANSESOPHAGEAL ECHOCARDIOGRAM (N/A)  SURGEON:  Surgeon(s) and Role:    * Alleen Borne, MD - Primary  PHYSICIAN ASSISTANT: Renzo Vincelette PA-C  ANESTHESIA:   general  EBL:  Total I/O In: 1333.9 [I.V.:1333.9] Out: 575 [Urine:575]  BLOOD ADMINISTERED: CELLSAVER  DRAINS: Left Pleural Chest Tube, Mediastinal Chest Drains   LOCAL MEDICATIONS USED:  NONE  SPECIMEN:  No Specimen  DISPOSITION OF SPECIMEN:  N/A  COUNTS:  YES  TOURNIQUET:  * No tourniquets in log *  DICTATION: .Dragon Dictation  PLAN OF CARE: Admit to inpatient   PATIENT DISPOSITION:  ICU - intubated and hemodynamically stable.   Delay start of Pharmacological VTE agent (>24hrs) due to surgical blood loss or risk of bleeding: yes

## 2013-12-18 NOTE — Brief Op Note (Signed)
   Brief Cardiac Catheterization Note: NAME:  Lee May   MRN: [id number] DOB:  04-10-1961   ADMIT DATE: 12/18/2013   Indication: 1. Anterior STEMI 2. Known prior CAD history 3. 2 PPD smoker with h/o poor medical adherence.  Procedures: 1. Left Heart Catheterization with Native Coroanry Angiography via Right Radial Artery access  TIG 4.0 catheter, Angled Pigtail 2. PTCA (only) of early mid LAD 100% thrombotic occlusion - reduced to ~20-30% ()  XB LAD Guide - 2.0 mm x 12 mm pre-dilation balloon;   2.5 mm x 20 mm balloon final PTCA  Findings:  dLM - 20%  Early mid LAD 100% thrombotic occlusion after takeoff of early bifurcating D1.  Post PTCA - the LAD reaches the Apex with 2 distal diagonal branches; there is residual ~25-30 mm of ~20-30% residual stenosis.  Ostial OM1 ~80% stenosis; small AV Groove Circumflex - may have occluded ~OM2  Mid RCA 100% occluded at RV Marginal; L-R collaterals from Diag & OM1 - Large RPL branch that back-fills to rPDA (but no PDA filling)  Hemodynamics:   Central AoP: 120/75/81 mmHg  LVP/EDP: 117/10/12 mmHg  LVEF ~45% with mid to apical inferior (inferoapical) moderate hypokinesis.  Medications:  2 mg Versed IV; 100 mcg Fentanyl IV  Omnipaque: 190 ml  IC NTG 400 mcg  Aggrestat bolus & drip  Angiomax bolus & gtt - stopped post PTCA (ACT >300 Sec)  Impression:  Severe Multivessel CAD with acute 100% thrombotic occlusion of the early mid LAD after D1 and what appears to be a more chronic / subacute occlusion of the mid RCA after RV Marginal with brisk collaterals from D1 & OM1 filling RPL, 80%ostial-proximal OM1.  PTCA only of early Mid LAD due to multivessel disease with need for CABG.  Mild-moderately reduced LVEF with mid to apical inferior hypokinesis.  Relatively normal LVEDP  Recommendations:  To CCu for post Radial care -- continue Aggrestat & start heparin iinfusion 4 hr post TR band  CT Surgical consult called -  Dr. Laneta Simmers to see this AM -- plan possible CABG tomorrow.  2 D Echo   Adjust cardiac meds - BB & ACE-I depending upon pressure.  Full note to follow  Marykay Lex, M.D., M.S. Interventional Cardiologist   Pager # [phone]  12/18/2013 6:09 AM

## 2013-12-18 NOTE — Procedures (Signed)
Extubation Procedure Note  Patient Details:   Name: Lee May DOB: 05/17/1961 MRN: [id number]   Airway Documentation:     Evaluation  O2 sats: stable throughout Complications: No apparent complications Patient did tolerate procedure well. Bilateral Breath Sounds: Clear;Diminished   Yes patient able to speak.  Extubated to 4L nasal cannula.  NIF: -30, VC:7.  Is instruct performed and patient reached 1000.  Patient able to speak and RT will continue to monitor.    Morley Kos B 12/18/2013, 10:00 PM

## 2013-12-18 NOTE — ED Notes (Signed)
EMS reports they gave an additional 1 SL Nitro en route.

## 2013-12-18 NOTE — Anesthesia Preprocedure Evaluation (Signed)
Anesthesia Evaluation  Patient identified by MRN, date of birth, ID band Patient awake    Reviewed: reviewed documented beta blocker date and time   History of Anesthesia Complications Negative for: history of anesthetic complications  Airway Mallampati: II TM Distance: >3 FB Neck ROM: Full    Dental  (+) Chipped, Dental Advisory Given, Loose   Pulmonary Current Smoker,  breath sounds clear to auscultation  Pulmonary exam normal       Cardiovascular hypertension, Pt. on medications + CAD and + Past MI Rhythm:Regular Rate:Normal     Neuro/Psych    GI/Hepatic negative GI ROS, Neg liver ROS,   Endo/Other  negative endocrine ROS  Renal/GU negative Renal ROS     Musculoskeletal   Abdominal   Peds  Hematology negative hematology ROS (+)   Anesthesia Other Findings   Reproductive/Obstetrics                           Anesthesia Physical Anesthesia Plan  ASA: III  Anesthesia Plan: General   Post-op Pain Management:    Induction: Intravenous  Airway Management Planned: Oral ETT  Additional Equipment: Arterial line, Ultrasound Guidance Line Placement, CVP, PA Cath and TEE  Intra-op Plan:   Post-operative Plan: Post-operative intubation/ventilation  Informed Consent: I have reviewed the patients History and Physical, chart, labs and discussed the procedure including the risks, benefits and alternatives for the proposed anesthesia with the patient or authorized representative who has indicated his/her understanding and acceptance.   Dental advisory given  Plan Discussed with: CRNA, Anesthesiologist and Surgeon  Anesthesia Plan Comments:         Anesthesia Quick Evaluation

## 2013-12-18 NOTE — Transfer of Care (Signed)
Immediate Anesthesia Transfer of Care Note  Patient: Lee May  Procedure(s) Performed: Procedure(s): CORONARY ARTERY BYPASS GRAFTING (CABG) (N/A) INTRAOPERATIVE TRANSESOPHAGEAL ECHOCARDIOGRAM (N/A)  Patient Location: SICU  Anesthesia Type:General  Level of Consciousness: sedated and Patient remains intubated per anesthesia plan  Airway & Oxygen Therapy: Patient remains intubated per anesthesia plan and Patient placed on Ventilator (see vital sign flow sheet for setting)  Post-op Assessment: Report given to PACU RN and Post -op Vital signs reviewed and stable  Post vital signs: Reviewed and stable  Complications: No apparent anesthesia complications

## 2013-12-18 NOTE — CV Procedure (Signed)
CARDIAC CATHETERIZATION AND PERCUTANEOUS CORONARY INTERVENTION REPORT  NAME:  Lee May   MRN: [id number] DOB:  Mar 27, 1961   ADMIT DATE: 12/18/2013 Procedure Date: 12/18/2013  INTERVENTIONAL CARDIOLOGIST: Leonie Man, M.D., MS PRIMARY CARE PROVIDER: No primary provider on file. PRIMARY CARDIOLOGIST: New to [facility]  PATIENT:  Lee May is a 53 y.o. male with a history of CAD s/p remote MI with PCI of unknown vessel about 6 years ago in [place]. He has not followed up with a Cardiologist since. He says that he probably has HTN and dyslipidemia but has not been taking any medications. He was in his USOH until around MN while trying to go to sleep he developed severe 10/10 sharp CP with right arm numbness. He got diaphoretic and nauseated. He said it was worse supine and improved sitting up. The pain persisted and he decided to come to the ER. In the ER he was noted to have some mild ST segment elevation in the anterolateral leads and Troponin was elevated at 1.17. He was given SL NTG with some improvement in his CP and now rates it a 6/10.  Code STEMI was activated by the [facility].  PRE-OPERATIVE DIAGNOSIS:    Anterilr STEMI  PROCEDURES PERFORMED:    Left Heart Catheterization with Native Coronary Angiography  via Right Radial Artery   Left Ventriculography  PTCA only of the 100% Thrombotically occluded early mid LAD.  PROCEDURE: The patient was brought to the 2nd [facility] in the fasting state and prepped and draped in the usual sterile fashion for Right Radial or Femoral artery access. A modified Allen's test was performed on the right wrist demonstrating excellent collateral flow for radial access.   Sterile technique was used including antiseptics, cap, gloves, gown, hand hygiene, mask and sheet. Skin prep: Chlorhexidine.   Consent: Risks of procedure as well as the alternatives and risks of each were explained to the  (patient/caregiver). Emergency Verbal Consent for procedure obtained.   Time Out: Verified patient identification, verified procedure, site/side was marked, verified correct patient position, special equipment/implants available, medications/allergies/relevent history reviewed, required imaging and test results available. Performed.  Access:   Right Radial Artery: 6 Fr Sheath -  Seldiinger Technique (Angiocath Micropuncture Kit)  Radial Cocktail - 10 mL;    Left Heart Catheterization: 5Fr Catheters advanced or exchanged over a Long exchange safety J-wire; TIG catheter advanced first.   Left & Right Coronary Artery Cineangiography: TIG Catheter    LV Hemodynamics (LV Gram): Angled Pigtail  Sheath removed in the Cath Lab TR Band placement for hemostasis.  TR Band: 0605  Hours; 14 mL air  FINDINGS:  Hemodynamics:   Central Aortic Pressure / Mean: 120/75/81 mmHg  Left Ventricular Pressure / LVEDP: 117/10/12 mmHg  Left Ventriculography:  EF: ~45% with mid to apical inferior (inferoapical) moderate hypokinesis.  Coronary Anatomy:  Dominance: Right  Left Main: Large caliber vessel that bifurcates into the LAD and Circumflex. Angiographically normal. LAD: Normal caliber vessel it gives rise to a proximal diagonal branch. After that the vessel is 100% occluded with thrombus.  Following PTCA, there is a long segment in the mid LAD of roughly 30 mm it is diffusely diseased  D1: Moderate caliber vessel that has a small proximal end moderate name channel. Minimal luminal irregularities distally.  Left Circumflex: Normal caliber vessel it gives off a lateral OM1 in the early portion of the AV groove. Beyond this there is a very diminutive  AV groove branch the parents continuation on insulin probably would have been OM 2 is occluded.  Lateral OM1: Moderate caliber vessel with ostial 70-80% stenosis.  The AV groove branch actually provides collaterals to the right posterior lateral branch  there is an extensive vessel with retrograde perfusion to what appeared to be the distal bifurcation.   RCA: Large caliber, dominant vessel that is occluded at the takeoff of an RV marginal branch.  RPL Sysytem:The RPAV is backfilled from a large RPL it gets left to right collaterals. Minimal flow is noted in what was probably the PDA.  After reviewing the initial angiography, the culprit lesion was thought to be 100% early mid LAD thrombotic occlusion.  Preparation were made to proceed with PCI on this lesion.  Percutaneous Coronary Intervention:   Lesion: Thrombotic 100% early mid LAD reduced to 30%; 30 flow pre-, TIMI-3 flow post Guide: 6 Fr  XB LAD 3.5 Guidewire: [place] Predilation Balloon: Emerge 2.0 mm x 12 mm;   8 Atm x 35 Sec, for 2 inflations - restored TIMI-3 flow Post-dilation Balloon: Trek 2.5 mm x 20 mm;   8 Atm x 60 Sec x 2, 10 Atm x 60 Sec  Post deployment angiography in multiple views, with and without guidewire in place revealed stable post PTCA balloon with TIMI-3 flow.  There was no evidence of dissection or perforation.  MEDICATIONS:  Anesthesia:  Local Lidocaine 2 ml  Sedation:  2 mg IV Versed, 100 mcg IV fentanyl ;   Premedication: 4000 mg IV Heparin along with 324 mg aspirin given in the emergency room.  Omnipaque Contrast:  190 ml  Anticoagulation:  Angiomax Bolus & drip  Anti-Platelet Agent:   Aggrastat bolus and drip  IC Nitroglycerin 400 mcg.  PATIENT DISPOSITION:    The patient was transferred to the PACU holding area in a hemodynamicaly stable, chest pain free condition.  The patient tolerated the procedure well, and there were no complications.  EBL:   < 10 ml  The patient was stable before, during, and after the procedure.  POST-OPERATIVE DIAGNOSIS:    Severe multivessel CAD involving 100% occlusion of the LAD and RCA with 70-80% stenosis of the lateral OM1 with possible occlusion of the terminal AV groove circumflex. Risk collaterals to  the RCA noted from the circumflex system  Successful PTCA only of the early mid LAD 100% thrombotic occlusion with restoration of TIMI-3 flow  Mildly reduced ejection fraction with mid-apical inferior hypokinesis  PLAN OF CARE:  The patient will initially be admitted to the CCU for postcatheterization care tear and removal. He will be continued on Aggrastat with heparin being restarted 6 hours post TR band removal.  Dr. Cyndia Bent from [place] [facility] has been contacted for consultation. He will see the patient this morning prior to rounds and consider CABG today versus tomorrow.  He will need aggressive respiratory modification including smoking cessation counseling and restoration of his cardiac medications including beta blocker statin and likely ACE inhibitor.  We'll check 2-D echocardiogram to assess EF.   Leonie Man, M.D., M.S. [facility] [address]. [place], [place]  [address]  [phone]  12/18/2013 6:09 AM

## 2013-12-18 NOTE — ED Notes (Signed)
Notified EDP, Miller,MD. Pt. I-stat troponin results 1.17.

## 2013-12-18 NOTE — Progress Notes (Signed)
ANTICOAGULATION CONSULT NOTE - Initial Consult  Pharmacy Consult for Heparin Indication: CAD   No Known Allergies  Patient Measurements: Height: 6' (182.9 cm) Weight: 230 lb (104.327 kg) IBW/kg (Calculated) : 77.6 Heparin Dosing Weight: 100 kg  Vital Signs: Temp: 97.8 F (36.6 C) (07/16 0401) Temp src: Oral (07/16 0401) BP: 102/72 mmHg (07/16 0450) Pulse Rate: 62 (07/16 0501)  Labs:  Recent Labs  12/18/13 0420  HGB 18.0*  HCT 51.4  PLT 322  APTT 29  LABPROT 13.1  INR 0.99  CREATININE 0.98    Estimated Creatinine Clearance: 111.4 ml/min (by C-G formula based on Cr of 0.98).   Medical History: Past Medical History  Diagnosis Date  . MI (myocardial infarction)   . CAD (coronary artery disease)   . Hypertension     Medications:  No prescriptions prior to admission    Assessment: 53 yo male with STEMI, s/p cath, awaiting possible CABG, for heparin and Aggrastat  Goal of Therapy:  Heparin level 0.3-0.5 Monitor platelets by anticoagulation protocol: Yes   Plan:  Start heparin 1500 units/hr 4 hrs post TR band removal Heparin level 8 hrs after restarting heparin  Talonda Artist, Gary Fleet 12/18/2013,6:39 AM

## 2013-12-18 NOTE — Progress Notes (Signed)
1110-1140 Cardiac Rehab Completed pre-op education with pt and mother. We discussed sternal precautions, use of IS and walking post-op. Pt voices understanding. He has pre-op surgery booklet and I gave them pt care guide. Pt's mother will be able to provide 24/7 care for him at discharge. Beatrix Fetters, RN 12/18/2013 11:45 AM

## 2013-12-19 ENCOUNTER — Inpatient Hospital Stay (HOSPITAL_COMMUNITY): Payer: Self-pay

## 2013-12-19 ENCOUNTER — Encounter (HOSPITAL_COMMUNITY): Payer: Self-pay | Admitting: Surgery

## 2013-12-19 DIAGNOSIS — F17200 Nicotine dependence, unspecified, uncomplicated: Secondary | ICD-10-CM | POA: Diagnosis present

## 2013-12-19 DIAGNOSIS — E785 Hyperlipidemia, unspecified: Secondary | ICD-10-CM | POA: Diagnosis present

## 2013-12-19 DIAGNOSIS — E8779 Other fluid overload: Secondary | ICD-10-CM

## 2013-12-19 DIAGNOSIS — I10 Essential (primary) hypertension: Secondary | ICD-10-CM | POA: Diagnosis present

## 2013-12-19 LAB — BASIC METABOLIC PANEL
Anion gap: 13 (ref 5–15)
Anion gap: 14 (ref 5–15)
BUN: 6 mg/dL (ref 6–23)
BUN: 9 mg/dL (ref 6–23)
CALCIUM: 7.5 mg/dL — AB (ref 8.4–10.5)
CO2: 24 meq/L (ref 19–32)
CO2: 26 mEq/L (ref 19–32)
CREATININE: 0.61 mg/dL (ref 0.50–1.35)
CREATININE: 0.91 mg/dL (ref 0.50–1.35)
Calcium: 8.5 mg/dL (ref 8.4–10.5)
Chloride: 101 mEq/L (ref 96–112)
Chloride: 96 mEq/L (ref 96–112)
GFR calc Af Amer: >90 mL/min (ref >90)
GFR calc Af Amer: >90 mL/min (ref >90)
GLUCOSE: 124 mg/dL — AB (ref 70–99)
Glucose, Bld: 133 mg/dL — ABNORMAL HIGH (ref 70–99)
Potassium: 3.7 mEq/L (ref 3.7–5.3)
Potassium: 4 mEq/L (ref 3.7–5.3)
SODIUM: 138 meq/L (ref 137–147)
Sodium: 136 mEq/L — ABNORMAL LOW (ref 137–147)

## 2013-12-19 LAB — CBC
HCT: 43.3 % (ref 39.0–52.0)
HEMATOCRIT: 45.6 % (ref 39.0–52.0)
Hemoglobin: 15 g/dL (ref 13.0–17.0)
Hemoglobin: 15.7 g/dL (ref 13.0–17.0)
MCH: 30.6 pg (ref 26.0–34.0)
MCH: 30.5 pg (ref 26.0–34.0)
MCHC: 34.6 g/dL (ref 30.0–36.0)
MCHC: 34.4 g/dL (ref 30.0–36.0)
MCV: 88.4 fL (ref 78.0–100.0)
MCV: 88.5 fL (ref 78.0–100.0)
Platelets: 182 10*3/uL (ref 150–400)
Platelets: 194 10*3/uL (ref 150–400)
RBC: 4.9 MIL/uL (ref 4.22–5.81)
RBC: 5.15 MIL/uL (ref 4.22–5.81)
RDW: 13.7 % (ref 11.5–15.5)
RDW: 13.8 % (ref 11.5–15.5)
WBC: 18.1 10*3/uL — ABNORMAL HIGH (ref 4.0–10.5)
WBC: 16.6 10*3/uL — ABNORMAL HIGH (ref 4.0–10.5)

## 2013-12-19 LAB — CREATININE, SERUM
Creatinine, Ser: 0.88 mg/dL (ref 0.50–1.35)
GFR calc non Af Amer: >90 mL/min (ref >90)

## 2013-12-19 LAB — GLUCOSE, CAPILLARY
Glucose-Capillary: 118 mg/dL — ABNORMAL HIGH (ref 70–99)
Glucose-Capillary: 131 mg/dL — ABNORMAL HIGH (ref 70–99)
Glucose-Capillary: 104 mg/dL — ABNORMAL HIGH (ref 70–99)
Glucose-Capillary: 100 mg/dL — ABNORMAL HIGH (ref 70–99)
Glucose-Capillary: 147 mg/dL — ABNORMAL HIGH (ref 70–99)

## 2013-12-19 LAB — MAGNESIUM
MAGNESIUM: 2.2 mg/dL (ref 1.5–2.5)
Magnesium: 2.2 mg/dL (ref 1.5–2.5)

## 2013-12-19 MED ORDER — INSULIN ASPART 100 UNIT/ML ~~LOC~~ SOLN
0.0000 [IU] | SUBCUTANEOUS | Status: DC
  Administered 2013-12-19 – 2013-12-20 (×3): 2 [IU] via SUBCUTANEOUS

## 2013-12-19 MED ORDER — POTASSIUM CHLORIDE 10 MEQ/50ML IV SOLN
10.0000 meq | INTRAVENOUS | Status: AC
  Administered 2013-12-19 (×3): 10 meq via INTRAVENOUS

## 2013-12-19 MED ORDER — ENOXAPARIN SODIUM 40 MG/0.4ML ~~LOC~~ SOLN
40.0000 mg | Freq: Every day | SUBCUTANEOUS | Status: DC
  Administered 2013-12-19 – 2013-12-22 (×4): 40 mg via SUBCUTANEOUS
  Filled 2013-12-19 (×6): qty 0.4

## 2013-12-19 MED ORDER — FUROSEMIDE 10 MG/ML IJ SOLN
40.0000 mg | Freq: Two times a day (BID) | INTRAMUSCULAR | Status: AC
  Administered 2013-12-19 (×2): 40 mg via INTRAVENOUS
  Filled 2013-12-19 (×2): qty 4

## 2013-12-19 MED ORDER — KETOROLAC TROMETHAMINE 15 MG/ML IJ SOLN
15.0000 mg | Freq: Four times a day (QID) | INTRAMUSCULAR | Status: DC | PRN
  Administered 2013-12-19 – 2013-12-21 (×3): 15 mg via INTRAVENOUS
  Filled 2013-12-19 (×3): qty 1

## 2013-12-19 MED ORDER — POTASSIUM CHLORIDE CRYS ER 20 MEQ PO TBCR
40.0000 meq | EXTENDED_RELEASE_TABLET | Freq: Two times a day (BID) | ORAL | Status: AC
  Administered 2013-12-19 (×2): 40 meq via ORAL
  Filled 2013-12-19 (×2): qty 2

## 2013-12-19 MED FILL — Sodium Chloride IV Soln 0.9%: INTRAVENOUS | Qty: 50 | Status: AC

## 2013-12-19 NOTE — Op Note (Signed)
CARDIOVASCULAR SURGERY OPERATIVE NOTE  12/19/2013  Surgeon:  Alleen BorneBryan K. Perle Gibbon, MD  First Assistant: Lowella DandyErin Barrett,  PA-C   Preoperative Diagnosis:  Severe multi-vessel coronary artery disease   Postoperative Diagnosis:  Same   Procedure:  1. Median Sternotomy 2. Extracorporeal circulation 3.   Coronary artery bypass grafting x 5   Left internal mammary graft to the LAD  SVG to OM1  SVG to AM  Sequential SVG to PDA and PL   4.   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 Around midnight he developed acute severe chest pain radiating down right arm, nausea and diaphoresis. In ER he had mild ST elevation in anterolateral leads and troponin of 1.17. Some improvement in pain with SL NTG. Cath showed occluded proximal LAD after D1, 80% OM1, occluded RCA after the RV marginal with left to right collat from the diagonal and OM1. The LAD was opened with PTCA with resolution of his pain. His EF is 45% with inferoapical hypokinesis. Since his anterior wall appears preserved and his RCA is chronically occluded I think it would be best to proceed with CABG this afternoon to prevent the LAD from closing again. Will hold Aggrastat and continue heparin. I discussed the operative procedure with the patient including alternatives, benefits and risks; including but not limited to bleeding, blood transfusion, infection, stroke, myocardial infarction, graft failure, heart block requiring a permanent pacemaker, organ dysfunction, and death. Lee May understands and agrees to proceed.    Preparation:  The patient was seen in the preoperative holding area and the correct patient, correct operation  were confirmed with the patient after reviewing the medical record and catheterization. The consent was signed by me. Preoperative antibiotics were given. A pulmonary arterial line and radial arterial line were placed by the anesthesia team. The patient was taken back to the operating room and positioned supine on the operating room table. After being placed under general endotracheal anesthesia by the anesthesia team a foley catheter was placed. The neck, chest, abdomen, and both legs were prepped with betadine soap and solution and draped in the usual sterile manner. A surgical time-out was taken and the correct patient and operative procedure were confirmed with the nursing and anesthesia staff.   Cardiopulmonary Bypass:  A median sternotomy was performed. The pericardium was opened in the midline. Right ventricular function appeared normal. The ascending aorta was of normal size and had no palpable plaque. There were no contraindications to aortic cannulation or cross-clamping. The patient was fully systemically heparinized and the ACT was maintained > 400 sec. The proximal aortic arch was cannulated with a 22 F aortic cannula for arterial inflow. Venous cannulation was performed via the right atrial appendage using a two-staged venous cannula. An antegrade cardioplegia/vent  cannula was inserted into the mid-ascending aorta. Aortic occlusion was performed with a single cross-clamp. Systemic cooling to 32 degrees Centigrade and topical cooling of the heart with iced saline were used. Hyperkalemic antegrade cold blood cardioplegia was used to induce diastolic arrest and was then given at about 20 minute intervals throughout the period of arrest to maintain myocardial temperature at or below 10 degrees centigrade. A temperature probe was inserted into the interventricular septum and an insulating pad was placed in the pericardium.   Left internal mammary harvest:  The left side of the sternum was  retracted using the Rultract retractor. The left internal mammary artery was harvested as a pedicle graft. All side branches were clipped. It was a medium-sized vessel of good quality with excellent blood flow. It was ligated distally and divided. It was sprayed with topical papaverine solution to prevent vasospasm.   Endoscopic vein harvest:  The right greater saphenous vein was harvested endoscopically through a 2 cm incision medial to the right knee. It was harvested from the upper thigh to below the knee. It was a medium-sized vein of good quality. The side branches were all ligated with 4-0 silk ties.    Coronary arteries:  The coronary arteries were examined.   LAD:  Large vessel with severe proximal and mid vessel disease. Some discoloration in the proximal vessel.  LCX:  OM1 moderate sized with no distal disease  RCA:  Acute marginal is a moderate-sized vessel. PDA is a small to moderate-sized vessel that is graftable but diffusely diseased. The PL is a large vessel with no disease.   Grafts:  1. LIMA to the LAD: 2.0 mm. It was sewn end to side using 8-0 prolene continuous suture. 2. SVG to OM:  1.6 mm. It was sewn end to side using 7-0 prolene continuous suture. 3. Sequential SVG to PDA:  1.6 mm. It was sewn sequential side to side using 7-0 prolene continuous suture. 4. Sequential SVG to PL:  1.75 mm. It was sewn sequential end to side using 7-0 prolene continuous suture. 5. SVG to AM: 1.6 mm. It was sewn end to side using 7-0 prolene continuous suture.  The proximal vein graft anastomoses were performed to the mid-ascending aorta using continuous 6-0 prolene suture. Graft markers were placed around the proximal anastomoses.   Completion:  The patient was rewarmed to 37 degrees Centigrade. The clamp was removed from the LIMA pedicle and there was rapid warming of the septum and return of ventricular fibrillation. The crossclamp was removed with a time of 94 minutes. There  was spontaneous return of sinus rhythm. The distal and proximal anastomoses were checked for hemostasis. The position of the grafts was satisfactory. Two temporary epicardial pacing wires were placed on the right atrium and two on the right ventricle. The patient was weaned from CPB without difficulty on low dose dopamine. CPB time was 116 minutes. Cardiac output was 8 LPM. Heparin was fully reversed with protamine and the aortic and venous cannulas removed. Hemostasis was achieved. Mediastinal and left pleural drainage tubes were placed. The sternum was closed with double #6 stainless steel wires. The fascia was closed with continuous # 1 vicryl suture. The subcutaneous tissue was closed with 2-0 vicryl continuous suture. The skin was closed with 3-0 vicryl subcuticular suture. All sponge, needle, and instrument counts were reported correct at the end of the case. Dry sterile dressings were placed over the incisions and around the chest tubes which were connected to pleurevac suction. The patient  was then transported to the surgical intensive care unit in critical but stable condition.

## 2013-12-19 NOTE — Anesthesia Postprocedure Evaluation (Signed)
  Anesthesia Post-op Note  Patient: Lee May  Procedure(s) Performed: Procedure(s): CORONARY ARTERY BYPASS GRAFTING (CABG) (N/A) INTRAOPERATIVE TRANSESOPHAGEAL ECHOCARDIOGRAM (N/A)  Patient Location: SICU  Anesthesia Type:General  Level of Consciousness: sedated and Patient remains intubated per anesthesia plan  Airway and Oxygen Therapy: Patient Spontanous Breathing, Patient remains intubated per anesthesia plan and Patient placed on Ventilator (see vital sign flow sheet for setting)  Post-op Pain: none  Post-op Assessment: Post-op Vital signs reviewed, Patient's Cardiovascular Status Stable, Respiratory Function Stable, Patent Airway and No signs of Nausea or vomiting  Post-op Vital Signs: stable  Last Vitals:  Filed Vitals:   12/19/13 1800  BP: 109/76  Pulse: 102  Temp:   Resp: 42    Complications: No apparent anesthesia complications

## 2013-12-19 NOTE — Progress Notes (Signed)
EKG CRITICAL VALUE     12 lead EKG performed.  Critical value noted. Wilhelmina Mcardle, RN notified.   Jessiah Steinhart H, CCT 12/19/2013 7:15 AM

## 2013-12-19 NOTE — Progress Notes (Signed)
1 Day Post-Op Procedure(s) (LRB): CORONARY ARTERY BYPASS GRAFTING (CABG) (N/A) INTRAOPERATIVE TRANSESOPHAGEAL ECHOCARDIOGRAM (N/A) Subjective: No complaints  Objective: Vital signs in last 24 hours: Temp:  [97.9 F (36.6 C)-99.3 F (37.4 C)] 98.1 F (36.7 C) (07/17 0700) Pulse Rate:  [64-105] 97 (07/17 0700) Cardiac Rhythm:  [-] Atrial paced (07/16 2100) Resp:  [9-55] 26 (07/17 0700) BP: (77-133)/(52-90) 113/59 mmHg (07/17 0700) SpO2:  [91 %-100 %] 96 % (07/17 0700) Arterial Line BP: (72-127)/(51-80) 96/65 mmHg (07/17 0700) FiO2 (%):  [40 %-50 %] 40 % (07/16 2110) Weight:  [103.7 kg (228 lb 9.9 oz)] 103.7 kg (228 lb 9.9 oz) (07/17 0418)  Hemodynamic parameters for last 24 hours: PAP: (24-61)/(10-43) 32/19 mmHg CO:  [5.4 L/min-6.5 L/min] 5.5 L/min CI:  [2.3 L/min/m2-2.7 L/min/m2] 2.3 L/min/m2  Intake/Output from previous day: 07/16 0701 - 07/17 0700 In: 7724.4 [I.V.:5374.4; Blood:1200; IV Piggyback:1150] Out: 8316 [Urine:4550; Emesis/NG output:50; Blood:1800; Chest Tube:590] Intake/Output this shift:    General appearance: alert and cooperative Neurologic: intact Heart: regular rate and rhythm, S1, S2 normal, no murmur, click, rub or gallop Lungs: clear to auscultation bilaterally Extremities: edema mild Wound: dressings dry  Lab Results:  Recent Labs  12/18/13 1814 12/18/13 1821 12/19/13 0345  WBC 20.7*  --  18.1*  HGB 13.9 13.9 15.0  HCT 40.8 41.0 43.3  PLT 186  --  182   BMET:  Recent Labs  12/18/13 0818  12/18/13 1707 12/18/13 1821 12/19/13 0345  NA 140  < > 141 140 138  K 4.0  < > 3.4* 3.5* 3.7  CL 99  < > 101  --  101  CO2 26  --   --   --  24  GLUCOSE 103*  < > 130* 113* 124*  BUN 10  < > 7  --  6  CREATININE 0.85  < > 0.70  --  0.61  CALCIUM 8.9  --   --   --  7.5*  < > = values in this interval not displayed.  PT/INR:  Recent Labs  12/18/13 1814  LABPROT 16.5*  INR 1.33   ABG    Component Value Date/Time   PHART 7.380 12/18/2013  2315   HCO3 23.4 12/18/2013 2315   TCO2 25 12/18/2013 2315   ACIDBASEDEF 2.0 12/18/2013 2315   O2SAT 94.0 12/18/2013 2315   CBG (last 3)   Recent Labs  12/18/13 2314 12/18/13 2350 12/19/13 0357  GLUCAP 104* 113* 118*   CXR: clear  ECG: sinus , anteroseptal infarct Assessment/Plan: S/P Procedure(s) (LRB): CORONARY ARTERY BYPASS GRAFTING (CABG) (N/A) INTRAOPERATIVE TRANSESOPHAGEAL ECHOCARDIOGRAM (N/A) Mobilize Diuresis Diabetes control Continue foley due to diuresing patient and patient in ICU See progression orders Will keep chest tubes in this am.   LOS: 1 day    Alleen Borne 12/19/2013

## 2013-12-19 NOTE — Progress Notes (Signed)
Patient ID: Lee May, male   DOB: 09-05-1960, 53 y.o.   MRN: [id number]  SICU Evening Rounds  Hemodynamically stable  Urine output ok  CT output serous BMET    Component Value Date/Time   NA 138 12/19/2013 0345   K 3.7 12/19/2013 0345   CL 101 12/19/2013 0345   CO2 24 12/19/2013 0345   GLUCOSE 124* 12/19/2013 0345   BUN 6 12/19/2013 0345   CREATININE 0.61 12/19/2013 0345   CALCIUM 7.5* 12/19/2013 0345   GFRNONAA >90 12/19/2013 0345   GFRAA >90 12/19/2013 0345    CBC    Component Value Date/Time   WBC 16.6* 12/19/2013 1810   RBC 5.15 12/19/2013 1810   HGB 15.7 12/19/2013 1810   HCT 45.6 12/19/2013 1810   PLT 194 12/19/2013 1810   MCV 88.5 12/19/2013 1810   MCH 30.5 12/19/2013 1810   MCHC 34.4 12/19/2013 1810   RDW 13.8 12/19/2013 1810   LYMPHSABS 1.3 12/18/2013 0818   MONOABS 0.6 12/18/2013 0818   EOSABS 0.0 12/18/2013 0818   BASOSABS 0.0 12/18/2013 0818    A/P: stable day.

## 2013-12-19 NOTE — Progress Notes (Signed)
Subjective:  Day 1 s/p CABG x5  Objective:   Vital Signs in the last 24 hours: Temp:  [97.9 F (36.6 C)-99.3 F (37.4 C)] 98.6 F (37 C) (07/17 1000) Pulse Rate:  [50-105] 50 (07/17 1100) Resp:  [9-55] 37 (07/17 1100) BP: (77-123)/(52-85) 115/79 mmHg (07/17 1100) SpO2:  [91 %-100 %] 93 % (07/17 1100) Arterial Line BP: (72-127)/(51-80) 107/62 mmHg (07/17 1000) FiO2 (%):  [40 %-50 %] 40 % (07/16 2110) Weight:  [228 lb 9.9 oz (103.7 kg)] 228 lb 9.9 oz (103.7 kg) (07/17 0418)  Intake/Output from previous day: 07/16 0701 - 07/17 0700 In: 7724.4 [I.V.:5374.4; Blood:1200; IV Piggyback:1150] Out: 8316 [Urine:4550; Emesis/NG output:50; Blood:1800; Chest Tube:590]  Medications: . acetaminophen  1,000 mg Oral 4 times per day   Or  . acetaminophen (TYLENOL) oral liquid 160 mg/5 mL  1,000 mg Per Tube 4 times per day  . aspirin EC  325 mg Oral Daily   Or  . aspirin  324 mg Per Tube Daily  . atorvastatin  40 mg Oral q1800  . bisacodyl  10 mg Oral Daily   Or  . bisacodyl  10 mg Rectal Daily  . cefUROXime (ZINACEF)  IV  1.5 g Intravenous Q12H  . chlorhexidine  15 mL Mouth/Throat Once  . Chlorhexidine Gluconate Cloth  6 each Topical Daily  . docusate sodium  200 mg Oral Daily  . enoxaparin (LOVENOX) injection  40 mg Subcutaneous QHS  . furosemide  40 mg Intravenous BID  . insulin aspart  0-24 Units Subcutaneous 6 times per day  . insulin aspart  0-24 Units Subcutaneous 6 times per day  . metoCLOPramide (REGLAN) injection  10 mg Intravenous 4 times per day  . metoprolol tartrate  12.5 mg Oral BID   Or  . metoprolol tartrate  12.5 mg Per Tube BID  . mupirocin ointment  1 application Nasal BID  . [START ON 12/20/2013] pantoprazole  40 mg Oral Daily  . potassium chloride  40 mEq Oral BID  . sodium chloride  3 mL Intravenous Q12H    . sodium chloride 20 mL/hr at 12/18/13 2000  . sodium chloride 10 mL/hr at 12/18/13 2100  . sodium chloride    . dexmedetomidine Stopped (12/18/13  2200)  . lactated ringers 20 mL/hr at 12/18/13 2100  . nitroGLYCERIN Stopped (12/18/13 1924)  . phenylephrine (NEO-SYNEPHRINE) Adult infusion Stopped (12/19/13 0030)    Physical Exam:   General appearance: alert, cooperative and no distress Neck: no adenopathy, no JVD, supple, symmetrical, trachea midline and thyroid not enlarged, symmetric, no tenderness/mass/nodules Lungs: decreased bs without wheezing Heart: regular rate and rhythm and no rub Abdomen: soft, non-tender; bowel sounds normal; no masses,  no organomegaly Extremities: trace edema Neurologic: Grossly normal   Rate: 99-100  Rhythm: sinus tachycardia  Lab Results:   Recent Labs  12/18/13 0818  12/18/13 1707 12/18/13 1821 12/19/13 0345  NA 140  < > 141 140 138  K 4.0  < > 3.4* 3.5* 3.7  CL 99  < > 101  --  101  CO2 26  --   --   --  24  GLUCOSE 103*  < > 130* 113* 124*  BUN 10  < > 7  --  6  CREATININE 0.85  < > 0.70  --  0.61  < > = values in this interval not displayed.   Recent Labs  12/18/13 0818 12/18/13 1150  TROPONINI 9.48* 19.91*    Hepatic Function Panel  Recent  Labs  12/18/13 0818  PROT 7.5  ALBUMIN 3.6  AST 53*  ALT 19  ALKPHOS 98  BILITOT 0.5    Recent Labs  12/18/13 1814  INR 1.33   BNP (last 3 results) No results found for this basename: PROBNP,  in the last 8760 hours  Lipid Panel  No results found for this basename: chol, trig, hdl, cholhdl, vldl, ldlcalc      Imaging:  Dg Chest Portable 1 View In Am  12/19/2013   CLINICAL DATA:  Postop for CABG.  EXAM: PORTABLE CHEST - 1 VIEW  COMPARISON:  One day prior  FINDINGS: Extubation removal of nasogastric tube. Right IJ Swan-Ganz catheter with tip at right pulmonary artery. Mediastinal drain. Two left chest tubes. Prior median sternotomy.  Cardiomegaly accentuated by AP portable technique. No pleural effusion or pneumothorax. Low lung volumes with resultant pulmonary interstitial prominence. No congestive failure. Minimal  bibasilar atelectasis.  IMPRESSION: Expected appearance after extubation, with diminished lung volumes and development of bibasilar atelectasis.  Cardiomegaly without congestive failure.  Other support apparatus stable in position, without pneumothorax.   Electronically Signed   By: Jeronimo Greaves M.D.   On: 12/19/2013 08:24   Dg Chest Portable 1 View  12/18/2013   CLINICAL DATA:  Postop CABG.  EXAM: PORTABLE CHEST - 1 VIEW  COMPARISON:  12/18/2013 and 09/08/2012  FINDINGS: Sternotomy wires are intact. Endotracheal tube has tip 4.4 cm above the carina. Enteric tube courses into the stomach where is coiled once with tip over the gastric fundus in the left upper quadrant. Mediastinal drain is present. Left-sided chest tube appears in adequate position. Right IJ Swan-Ganz catheter has tip over the right main pulmonary artery.  Lungs are hypoinflated without focal consolidation, effusion or pneumothorax. Subtle stable prominence of the perihilar markings. Cardiomediastinal silhouette and remainder of the exam is unremarkable.  IMPRESSION: Hypoinflation without acute cardiopulmonary disease.  Tubes and lines as described.   Electronically Signed   By: Elberta Fortis M.D.   On: 12/18/2013 18:58   Dg Chest Port 1 View  12/18/2013   CLINICAL DATA:  Acute onset chest pain.  EXAM: PORTABLE CHEST - 1 VIEW  COMPARISON:  Chest radiograph September 08, 2012  FINDINGS: Cardiomediastinal silhouette is unremarkable for this low inspiratory portable examination with crowded vasculature markings. Central pulmonary vascular congestion. Similar mild chronic interstitial changes. The lungs are clear without pleural effusions or focal consolidations. Trachea projects midline and there is no pneumothorax. Included soft tissue planes and osseous structures are non-suspicious.  IMPRESSION: Central pulmonary vascular congestion superimposed on mild chronic interstitial changes.   Electronically Signed   By: Awilda Metro   On: 12/18/2013  04:33      Assessment/Plan:   Active Problems:   ST elevation myocardial infarction (STEMI) of anterolateral wall   Acute ST elevation myocardial infarction (STEMI) involving left anterior descending coronary artery   S/P CABG x 5  Stable hemodynamincs off pressors. I/O -1563 since admission. C/O soreness and being clammy. Rhythm stable. CT still in place today.    Lennette Bihari, MD, [facility] 12/19/2013, 12:28 PM

## 2013-12-20 ENCOUNTER — Inpatient Hospital Stay (HOSPITAL_COMMUNITY): Payer: Self-pay

## 2013-12-20 LAB — GLUCOSE, CAPILLARY
GLUCOSE-CAPILLARY: 128 mg/dL — AB (ref 70–99)
GLUCOSE-CAPILLARY: 102 mg/dL — AB (ref 70–99)
GLUCOSE-CAPILLARY: 113 mg/dL — AB (ref 70–99)
Glucose-Capillary: 135 mg/dL — ABNORMAL HIGH (ref 70–99)

## 2013-12-20 LAB — BASIC METABOLIC PANEL
Anion gap: 14 (ref 5–15)
BUN: 14 mg/dL (ref 6–23)
CALCIUM: 8.9 mg/dL (ref 8.4–10.5)
CO2: 29 mEq/L (ref 19–32)
Chloride: 97 mEq/L (ref 96–112)
Creatinine, Ser: 0.97 mg/dL (ref 0.50–1.35)
GFR calc Af Amer: >90 mL/min (ref >90)
Glucose, Bld: 124 mg/dL — ABNORMAL HIGH (ref 70–99)
Potassium: 4.4 mEq/L (ref 3.7–5.3)
SODIUM: 140 meq/L (ref 137–147)

## 2013-12-20 LAB — CBC
HCT: 45 % (ref 39.0–52.0)
Hemoglobin: 15.4 g/dL (ref 13.0–17.0)
MCH: 30.7 pg (ref 26.0–34.0)
MCHC: 34.2 g/dL (ref 30.0–36.0)
MCV: 89.6 fL (ref 78.0–100.0)
PLATELETS: 195 10*3/uL (ref 150–400)
RBC: 5.02 MIL/uL (ref 4.22–5.81)
RDW: 14 % (ref 11.5–15.5)
WBC: 20.1 10*3/uL — ABNORMAL HIGH (ref 4.0–10.5)

## 2013-12-20 MED ORDER — METOPROLOL TARTRATE 25 MG PO TABS
25.0000 mg | ORAL_TABLET | Freq: Two times a day (BID) | ORAL | Status: DC
  Administered 2013-12-20 – 2013-12-23 (×4): 25 mg via ORAL
  Filled 2013-12-20 (×8): qty 1

## 2013-12-20 MED ORDER — ALPRAZOLAM 0.5 MG PO TABS
0.5000 mg | ORAL_TABLET | Freq: Two times a day (BID) | ORAL | Status: DC | PRN
  Administered 2013-12-20 – 2013-12-22 (×4): 0.5 mg via ORAL
  Filled 2013-12-20 (×4): qty 1

## 2013-12-20 MED ORDER — ONDANSETRON HCL 4 MG/2ML IJ SOLN
4.0000 mg | Freq: Four times a day (QID) | INTRAMUSCULAR | Status: DC | PRN
Start: 2013-12-20 — End: 2013-12-23
  Administered 2013-12-20 (×2): 4 mg via INTRAVENOUS
  Filled 2013-12-20 (×2): qty 2

## 2013-12-20 MED ORDER — SODIUM CHLORIDE 0.9 % IJ SOLN
3.0000 mL | INTRAMUSCULAR | Status: DC | PRN
  Administered 2013-12-20: 3 mL via INTRAVENOUS

## 2013-12-20 MED ORDER — SODIUM CHLORIDE 0.9 % IV SOLN: 250.0000 mL | INTRAVENOUS | Status: DC | PRN

## 2013-12-20 MED ORDER — SODIUM CHLORIDE 0.9 % IJ SOLN
3.0000 mL | Freq: Two times a day (BID) | INTRAMUSCULAR | Status: DC
  Administered 2013-12-20 – 2013-12-23 (×4): 3 mL via INTRAVENOUS

## 2013-12-20 MED ORDER — OXYCODONE HCL 5 MG PO TABS
5.0000 mg | ORAL_TABLET | ORAL | Status: DC | PRN
  Administered 2013-12-20 – 2013-12-23 (×5): 10 mg via ORAL
  Filled 2013-12-20 (×5): qty 2

## 2013-12-20 MED ORDER — NICOTINE 21 MG/24HR TD PT24
21.0000 mg | MEDICATED_PATCH | Freq: Every day | TRANSDERMAL | Status: DC
  Administered 2013-12-20 – 2013-12-23 (×4): 21 mg via TRANSDERMAL
  Filled 2013-12-20 (×5): qty 1

## 2013-12-20 MED ORDER — ASPIRIN EC 325 MG PO TBEC
325.0000 mg | DELAYED_RELEASE_TABLET | Freq: Every day | ORAL | Status: DC
  Administered 2013-12-21 – 2013-12-23 (×3): 325 mg via ORAL
  Filled 2013-12-20 (×3): qty 1

## 2013-12-20 MED ORDER — ONDANSETRON HCL 4 MG PO TABS: 4.0000 mg | ORAL_TABLET | Freq: Four times a day (QID) | ORAL | Status: DC | PRN

## 2013-12-20 MED ORDER — ACETAMINOPHEN 325 MG PO TABS: 650.0000 mg | ORAL_TABLET | Freq: Four times a day (QID) | ORAL | Status: DC | PRN

## 2013-12-20 MED ORDER — MOVING RIGHT ALONG BOOK
Freq: Once | Status: AC
  Administered 2013-12-20: 12:00:00
  Filled 2013-12-20 (×2): qty 1

## 2013-12-20 MED ORDER — BISACODYL 5 MG PO TBEC
10.0000 mg | DELAYED_RELEASE_TABLET | Freq: Every day | ORAL | Status: DC | PRN
Start: 2013-12-20 — End: 2013-12-23
  Administered 2013-12-23: 10 mg via ORAL
  Filled 2013-12-20 (×2): qty 2

## 2013-12-20 MED ORDER — FUROSEMIDE 40 MG PO TABS
40.0000 mg | ORAL_TABLET | Freq: Every day | ORAL | Status: DC
  Administered 2013-12-20 – 2013-12-21 (×2): 40 mg via ORAL
  Filled 2013-12-20 (×3): qty 1

## 2013-12-20 MED ORDER — BISACODYL 10 MG RE SUPP: 10.0000 mg | Freq: Every day | RECTAL | Status: DC | PRN

## 2013-12-20 MED ORDER — POTASSIUM CHLORIDE CRYS ER 20 MEQ PO TBCR
40.0000 meq | EXTENDED_RELEASE_TABLET | Freq: Every day | ORAL | Status: DC
  Administered 2013-12-21: 40 meq via ORAL
  Filled 2013-12-20: qty 2

## 2013-12-20 MED ORDER — PANTOPRAZOLE SODIUM 40 MG PO TBEC
40.0000 mg | DELAYED_RELEASE_TABLET | Freq: Every day | ORAL | Status: DC
  Administered 2013-12-21 – 2013-12-23 (×3): 40 mg via ORAL
  Filled 2013-12-20 (×3): qty 1

## 2013-12-20 MED ORDER — DOCUSATE SODIUM 100 MG PO CAPS
200.0000 mg | ORAL_CAPSULE | Freq: Every day | ORAL | Status: DC
  Administered 2013-12-22 – 2013-12-23 (×2): 200 mg via ORAL
  Filled 2013-12-20 (×4): qty 2

## 2013-12-20 NOTE — Progress Notes (Signed)
2 Days Post-Op Procedure(s) (LRB): CORONARY ARTERY BYPASS GRAFTING (CABG) (N/A) INTRAOPERATIVE TRANSESOPHAGEAL ECHOCARDIOGRAM (N/A) Subjective: No specific complaints  Objective: Vital signs in last 24 hours: Temp:  [98 F (36.7 C)-98.7 F (37.1 C)] 98.7 F (37.1 C) (07/18 0811) Pulse Rate:  [86-107] 99 (07/18 1100) Cardiac Rhythm:  [-] Sinus tachycardia;Normal sinus rhythm (07/18 0811) Resp:  [10-42] 27 (07/18 0800) BP: (87-123)/(59-93) 123/93 mmHg (07/18 1100) SpO2:  [91 %-99 %] 99 % (07/18 0800) Weight:  [102.6 kg (226 lb 3.1 oz)] 102.6 kg (226 lb 3.1 oz) (07/18 0500)  Hemodynamic parameters for last 24 hours:    Intake/Output from previous day: 07/17 0701 - 07/18 0700 In: 670 [I.V.:470; IV Piggyback:200] Out: 2015 [Urine:1775; Chest Tube:240] Intake/Output this shift: Total I/O In: 190 [P.O.:120; I.V.:20; IV Piggyback:50] Out: 30 [Urine:30]  General appearance: alert and cooperative Neurologic: intact Heart: regular rate and rhythm, S1, S2 normal, no murmur, click, rub or gallop Lungs: clear to auscultation bilaterally Abdomen: soft, non-tender; bowel sounds normal; no masses,  no organomegaly Extremities: edema mild Wound: incision ok  Lab Results:  Recent Labs  12/19/13 1810 12/20/13 0535  WBC 16.6* 20.1*  HGB 15.7 15.4  HCT 45.6 45.0  PLT 194 195   BMET:  Recent Labs  12/19/13 1810 12/20/13 0535  NA 136* 140  K 4.0 4.4  CL 96 97  CO2 26 29  GLUCOSE 133* 124*  BUN 9 14  CREATININE 0.88  0.91 0.97  CALCIUM 8.5 8.9    PT/INR:  Recent Labs  12/18/13 1814  LABPROT 16.5*  INR 1.33   ABG    Component Value Date/Time   PHART 7.380 12/18/2013 2315   HCO3 23.4 12/18/2013 2315   TCO2 25 12/18/2013 2315   ACIDBASEDEF 2.0 12/18/2013 2315   O2SAT 94.0 12/18/2013 2315   CBG (last 3)   Recent Labs  12/20/13 0032 12/20/13 0420 12/20/13 0810  GLUCAP 135* 128* 102*    Assessment/Plan: S/P Procedure(s) (LRB): CORONARY ARTERY BYPASS GRAFTING  (CABG) (N/A) INTRAOPERATIVE TRANSESOPHAGEAL ECHOCARDIOGRAM (N/A) Preop anteroseptal infarct due to acute LAD occlusion Mobilize Diuresis Diabetes control Nicotine patch  Xanax for anxiety and sleep Plan for transfer to step-down: see transfer orders   LOS: 2 days    BARTLE,BRYAN K 12/20/2013

## 2013-12-21 LAB — BASIC METABOLIC PANEL
Anion gap: 16 — ABNORMAL HIGH (ref 5–15)
BUN: 16 mg/dL (ref 6–23)
CHLORIDE: 96 meq/L (ref 96–112)
CO2: 28 mEq/L (ref 19–32)
Calcium: 8.9 mg/dL (ref 8.4–10.5)
Creatinine, Ser: 1 mg/dL (ref 0.50–1.35)
GFR calc Af Amer: >90 mL/min (ref >90)
GFR calc non Af Amer: 85 mL/min — ABNORMAL LOW (ref >90)
Glucose, Bld: 88 mg/dL (ref 70–99)
Potassium: 4.6 mEq/L (ref 3.7–5.3)
Sodium: 140 mEq/L (ref 137–147)

## 2013-12-21 LAB — CBC
HCT: 43.4 % (ref 39.0–52.0)
Hemoglobin: 14.2 g/dL (ref 13.0–17.0)
MCH: 30.1 pg (ref 26.0–34.0)
MCHC: 32.7 g/dL (ref 30.0–36.0)
MCV: 91.9 fL (ref 78.0–100.0)
PLATELETS: 194 10*3/uL (ref 150–400)
RBC: 4.72 MIL/uL (ref 4.22–5.81)
RDW: 14.1 % (ref 11.5–15.5)
WBC: 16.1 10*3/uL — AB (ref 4.0–10.5)

## 2013-12-21 MED ORDER — LACTULOSE 10 GM/15ML PO SOLN
20.0000 g | Freq: Every day | ORAL | Status: DC | PRN
  Administered 2013-12-21: 20 g via ORAL
  Filled 2013-12-21: qty 30

## 2013-12-21 NOTE — Progress Notes (Addendum)
[address]       [place] [address]             [phone]      3 Days Post-Op Procedure(s) (LRB): CORONARY ARTERY BYPASS GRAFTING (CABG) (N/A) INTRAOPERATIVE TRANSESOPHAGEAL ECHOCARDIOGRAM (N/A) Subjective: Somewhat uncomfortable d/t abd cramps. + flatus, -BM  Objective: Vital signs in last 24 hours: Temp:  [97.2 F (36.2 C)-97.6 F (36.4 C)] 97.2 F (36.2 C) (07/19 0507) Pulse Rate:  [90-109] 97 (07/19 1005) Cardiac Rhythm:  [-] Normal sinus rhythm;Sinus tachycardia (07/19 0818) Resp:  [18-25] 18 (07/19 0507) BP: (97-119)/(62-90) 97/63 mmHg (07/19 1005) SpO2:  [90 %-93 %] 90 % (07/19 0507) Weight:  [224 lb 13.9 oz (102 kg)] 224 lb 13.9 oz (102 kg) (07/19 0540)  Hemodynamic parameters for last 24 hours:    Intake/Output from previous day: 07/18 0701 - 07/19 0700 In: 270 [P.O.:120; I.V.:100; IV Piggyback:50] Out: 1015 [Urine:1015] Intake/Output this shift: Total I/O In: 360 [P.O.:360] Out: -   General appearance: alert, cooperative and no distress Heart: regular rate and rhythm Lungs: dim in the bases Abdomen: soft, non-tender, + BS Extremities: minor edema right LE Wound: incis without signs of infection  Lab Results:  Recent Labs  12/20/13 0535 12/21/13 0310  WBC 20.1* 16.1*  HGB 15.4 14.2  HCT 45.0 43.4  PLT 195 194   BMET:  Recent Labs  12/20/13 0535 12/21/13 0310  NA 140 140  K 4.4 4.6  CL 97 96  CO2 29 28  GLUCOSE 124* 88  BUN 14 16  CREATININE 0.97 1.00  CALCIUM 8.9 8.9    PT/INR:  Recent Labs  12/18/13 1814  LABPROT 16.5*  INR 1.33   ABG    Component Value Date/Time   PHART 7.380 12/18/2013 2315   HCO3 23.4 12/18/2013 2315   TCO2 25 12/18/2013 2315   ACIDBASEDEF 2.0 12/18/2013 2315   O2SAT 94.0 12/18/2013 2315   CBG (last 3)   Recent Labs  12/20/13 0420 12/20/13 0810 12/20/13 1120  GLUCAP 128* 102* 113*   Scheduled Meds: . aspirin EC  325 mg Oral Daily  . atorvastatin  40 mg Oral q1800  .  Chlorhexidine Gluconate Cloth  6 each Topical Daily  . docusate sodium  200 mg Oral Daily  . enoxaparin (LOVENOX) injection  40 mg Subcutaneous QHS  . furosemide  40 mg Oral Daily  . metoprolol tartrate  25 mg Oral BID  . mupirocin ointment  1 application Nasal BID  . nicotine  21 mg Transdermal Daily  . pantoprazole  40 mg Oral QAC breakfast  . potassium chloride  40 mEq Oral Daily  . sodium chloride  3 mL Intravenous Q12H   Continuous Infusions:  PRN Meds:.sodium chloride, acetaminophen, ALPRAZolam, bisacodyl, bisacodyl, ketorolac, ondansetron (ZOFRAN) IV, ondansetron, oxyCODONE, pneumococcal 23 valent vaccine, sodium chloride  Dg Chest Port 1 View  12/20/2013   CLINICAL DATA:  Postop cardiac surgery.  EXAM: PORTABLE CHEST - 1 VIEW  COMPARISON:  12/19/2013  FINDINGS: With the exception of the right internal jugular introducer sheath, all lines and tubes have been removed. No pneumothorax or edema. No mediastinal widening. Mild basilar atelectasis, similar to the prior study.  IMPRESSION: No acute findings.  Mild persistent lung base atelectasis.   Electronically Signed   By: Amie Portland M.D.   On: 12/20/2013 08:00    Assessment/Plan: S/P Procedure(s) (LRB): CORONARY ARTERY BYPASS GRAFTING (CABG) (N/A) INTRAOPERATIVE TRANSESOPHAGEAL ECHOCARDIOGRAM (N/A) 1 hemodyn stable with some  sinus tachy, follow, may need to increse beta blocker 2 H/H stable, leukocytosis improving.  3 renal fxn normal. Mild volume overload 4 cont pulm toilet/rehab 5 add lactulose prn for constipation   LOS: 3 days    GOLD,WAYNE E 12/21/2013   Chart reviewed, patient examined, agree with above. He is making good progress for POD 3.  Weight is below preop so will stop lasix Probably home Tuesday.

## 2013-12-22 MED ORDER — POTASSIUM CHLORIDE CRYS ER 20 MEQ PO TBCR: 20.0000 meq | EXTENDED_RELEASE_TABLET | Freq: Every day | ORAL | Status: DC

## 2013-12-22 MED ORDER — POLYETHYLENE GLYCOL 3350 17 G PO PACK
17.0000 g | PACK | Freq: Every day | ORAL | Status: DC
  Administered 2013-12-22 – 2013-12-23 (×2): 17 g via ORAL
  Filled 2013-12-22 (×3): qty 1

## 2013-12-22 MED ORDER — FUROSEMIDE 40 MG PO TABS
40.0000 mg | ORAL_TABLET | Freq: Every day | ORAL | Status: DC
  Administered 2013-12-22 – 2013-12-23 (×2): 40 mg via ORAL
  Filled 2013-12-22 (×3): qty 1

## 2013-12-22 MED ORDER — LACTULOSE 10 GM/15ML PO SOLN
20.0000 g | Freq: Once | ORAL | Status: AC
  Administered 2013-12-22: 20 g via ORAL
  Filled 2013-12-22: qty 30

## 2013-12-22 MED FILL — Sodium Bicarbonate IV Soln 8.4%: INTRAVENOUS | Qty: 50 | Status: AC

## 2013-12-22 MED FILL — Lidocaine HCl IV Inj 20 MG/ML: INTRAVENOUS | Qty: 5 | Status: AC

## 2013-12-22 MED FILL — Mannitol IV Soln 20%: INTRAVENOUS | Qty: 500 | Status: AC

## 2013-12-22 MED FILL — Heparin Sodium (Porcine) Inj 1000 Unit/ML: INTRAMUSCULAR | Qty: 60 | Status: AC

## 2013-12-22 MED FILL — Electrolyte-R (PH 7.4) Solution: INTRAVENOUS | Qty: 3000 | Status: AC

## 2013-12-22 MED FILL — Sodium Chloride IV Soln 0.9%: INTRAVENOUS | Qty: 3000 | Status: AC

## 2013-12-22 NOTE — Progress Notes (Signed)
CARDIAC REHAB PHASE I   PRE:  Rate/Rhythm: 91 SR  BP:  Supine: 103/71  Sitting:   Standing:    SaO2: 97%RA  MODE:  Ambulation: 700 ft   POST:  Rate/Rhythm: 90 SR  BP:  Supine:   Sitting: 113/79  Standing:    SaO2: 98%RA 0905-0928 Pt walked 700 ft on RA with steady gait. Walked independently. Tolerated well. No bed after walk.    Luetta Nutting, RN BSN  12/22/2013 9:25 AM

## 2013-12-22 NOTE — Progress Notes (Signed)
DC EPW per MD orders and protocol; wires intact; pt tolerated well; pt on bedrest for one hour; Q15 vitals for one hour; call bell within reach; will continue to monitor.  Hermina Barters, RN

## 2013-12-22 NOTE — Discharge Summary (Signed)
Physician Discharge Summary       [address]       [place] [address]             [phone]    Patient ID: Lee May MRN: [id number] DOB/AGE: [birth year] 53 y.o.  Admit date: 12/18/2013 Discharge date: 12/23/2013  Admission Diagnoses: 1. S/p STEMI 2. History of CAD (s/p MI, PCI) 3. History of hypertension 4. History of tobacco abuse  Discharge Diagnoses:  1. S/p STEMI 2. History of CAD (s/p MI, PCI) 3. History of hypertension 4. History of tobacco abuse 5. Dyslipidemia   Procedure (s):  Cardiac Catheterization done by Dr. Ellyn Hack on 12/18/2013: FINDINGS:  Hemodynamics:  Central Aortic Pressure / Mean: 120/75/81 mmHg  Left Ventricular Pressure / LVEDP: 117/10/12 mmHg Left Ventriculography:  EF: ~45% with mid to apical inferior (inferoapical) moderate hypokinesis. Coronary Anatomy:  Dominance: Right Left Main: Large caliber vessel that bifurcates into the LAD and Circumflex. Angiographically normal. LAD: Normal caliber vessel it gives rise to a proximal diagonal branch. After that the vessel is 100% occluded with thrombus.  Following PTCA, there is a long segment in the mid LAD of roughly 30 mm it is diffusely diseased  D1: Moderate caliber vessel that has a small proximal end moderate name channel. Minimal luminal irregularities distally. Left Circumflex: Normal caliber vessel it gives off a lateral OM1 in the early portion of the AV groove. Beyond this there is a very diminutive AV groove branch the parents continuation on insulin probably would have been OM 2 is occluded.  Lateral OM1: Moderate caliber vessel with ostial 70-80% stenosis.  The AV groove branch actually provides collaterals to the right posterior lateral branch there is an extensive vessel with retrograde perfusion to what appeared to be the distal bifurcation. RCA: Large caliber, dominant vessel that is occluded at the takeoff of an RV marginal branch.  RPL Sysytem:The RPAV is  backfilled from a large RPL it gets left to right collaterals. Minimal flow is noted in what was probably the PDA. After reviewing the initial angiography, the culprit lesion was thought to be 100% early mid LAD thrombotic occlusion. Preparation were made to proceed with PCI on this lesion.  Percutaneous Coronary Intervention:  Lesion: Thrombotic 100% early mid LAD reduced to 30%; 30 flow pre-, TIMI-3 flow post  Guide: 6 Fr XB LAD 3.5 Guidewire: Prowater  Predilation Balloon: Emerge 2.0 mm x 12 mm;  8 Atm x 35 Sec, for 2 inflations - restored TIMI-3 flow Post-dilation Balloon: Trek 2.5 mm x 20 mm;  8 Atm x 60 Sec x 2, 10 Atm x 60 Sec Post deployment angiography in multiple views, with and without guidewire in place revealed stable post PTCA balloon with TIMI-3 flow. There was no evidence of dissection or perforation  1. Median Sternotomy 2. Extracorporeal circulation 3. Coronary artery bypass grafting x 5  Left internal mammary graft to the LAD  SVG to OM1  SVG to AM  Sequential SVG to PDA and PL  4. Endoscopic vein harvest from the right leg by Dr. Cyndia Bent on  12/19/2013.  History of Presenting Illness: This is a 53yo Caucasian male with a history of coronary artery disease (s/p remote MI with PCI of unknown vessel) about 6 years ago in [place]. He has not followed up with a Cardiologist since. He says that he probably has hypertension and dyslipidemia but has not been taking any medications. He was in his usual state of health until around midnight;while trying  to go to sleep, he developed severe 10/10 sharp chest pain with right arm numbness. He got diaphoretic and nauseated. He said it was worse supine and improved sitting up. The pain persisted and he decided to come to the ER. In the ER, he was noted to have some mild ST segment elevation in the anterolateral leads and Troponin was elevated at 1.17. He was given SL NTG with some improvement in his CP and now rates it a 6/10. His Troponin I  peaked at 19.91. He ruled in for a STEMI. He underwent a cardiac catheterization and PCI of the LAD on 7/16. 2 D echo done showed no significant valvular disease, LVEF is approximately 35 to 40% with hypokinesis of inferior, inferoseptal and distal posterior walls; dyskinesis at apex.       A cardiothoracic consultation was obtained with Dr. Cyndia Bent for the consideration of emergent coronary artery bypass grafting surgery. Potential risks, complications, and benefits of the surgery were discussed with the patient and he agreed to proceed with surgery. Pre operative carotid duplex US showed a 40-59% right internal carotid artery stenosis and no significant carotid artery stenosis on the left.He underwent a CABG x 5 on 7/16.  Brief Hospital Course:  The patient was extubated the evening of surgery without difficulty. He remained afebrile and hemodynamically stable. He was weaned off of Dopamine and Neo synephrine drips. Gordy Councilman, a line, chest tubes, and foley were removed early in the post operative course. Lopressor was started and titrated accordingly. He was volume over loaded and diuresed. He was weaned off the insulin drip. The patient's HGA1C pre op was 5.6 The patient was felt surgically stable for transfer from the ICU to [facility]U for further convalescence on 12/20/2012. He continues to progress with cardiac rehab. He was ambulating on room air. He has been tolerating a diet and has had a bowel movement. Epicardial pacing wires will be removed prior to discharge. Chest tube sutures will be removed at the office after discharge. He has been constipated. He was given both oral laxatives and an enema. He did finally have a bowel movement. He was felt surgically stable for discharge today.    Latest Vital Signs: Blood pressure 118/93, pulse 95, temperature 98 F (36.7 C), temperature source Oral, resp. rate 18, height 6' (1.829 m), weight 222 lb 7.1 oz (100.9 kg), SpO2 96.00%.  Physical  Exam: Cardiovascular: RRR, no murmurs, gallops, or rubs.  Pulmonary: Clear to auscultation bilaterally; no rales, wheezes, or rhonchi.  Abdomen: Soft, non tender, bowel sounds present.  Extremities: Mild bilateral lower extremity edema.  Wounds: Clean and dry. No erythema or signs of infection.  Discharge Condition:Stable  Recent laboratory studies:  Lab Results  Component Value Date   WBC 16.1* 12/21/2013   HGB 14.2 12/21/2013   HCT 43.4 12/21/2013   MCV 91.9 12/21/2013   PLT 194 12/21/2013   Lab Results  Component Value Date   NA 140 12/21/2013   K 4.6 12/21/2013   CL 96 12/21/2013   CO2 28 12/21/2013   CREATININE 1.00 12/21/2013   GLUCOSE 88 12/21/2013      Diagnostic Studies: Dg Chest Port 1 View  12/20/2013   CLINICAL DATA:  Postop cardiac surgery.  EXAM: PORTABLE CHEST - 1 VIEW  COMPARISON:  12/19/2013  FINDINGS: With the exception of the right internal jugular introducer sheath, all lines and tubes have been removed. No pneumothorax or edema. No mediastinal widening. Mild basilar atelectasis, similar to the prior study.  IMPRESSION:  No acute findings.  Mild persistent lung base atelectasis.   Electronically Signed   By: Lajean Manes M.D. On: 12/20/2013 08:00   Discharge Instructions   Amb Referral to Cardiac Rehabilitation    Complete by:  As directed            Discharge Medications:   Medication List         aspirin 325 MG EC tablet  Take 1 tablet (325 mg total) by mouth daily.     atorvastatin 40 MG tablet  Commonly known as:  LIPITOR  Take 1 tablet (40 mg total) by mouth daily at 6 PM.     metoprolol tartrate 25 MG tablet  Commonly known as:  LOPRESSOR  Take 1 tablet (25 mg total) by mouth 2 (two) times daily.     oxyCODONE 5 MG immediate release tablet  Commonly known as:  Oxy IR/ROXICODONE  Take 1-2 tablets (5-10 mg total) by mouth every 4 (four) hours as needed for severe pain.        The patient has been discharged on:   1.Beta Blocker:  Yes [  x  ]                              No   [   ]                              If No, reason:  2.Ace Inhibitor/ARB: Yes [   ]                                     No  [  x  ]                                     If No, reason: Labile BP  3.Statin:   Yes [ x  ]                  No  [   ]                  If No, reason:  4.Ecasa:  Yes  [ x  ]                  No   [   ]                  If No, reason: Follow Up Appointments: Follow-up Information   Follow up with Sueanne Margarita, MD. (Call for an appointment for 2 weeks)    Specialty:  Cardiology   Contact information:   [address] [place] [address] [phone]       Follow up with Gaye Pollack, MD On 12/29/2013. (Appointment is with NURSE to have chest tube sutures removed. Apponintment time is at 10:00 am)    Specialty:  Cardiothoracic Surgery   Contact information:   [address] [place] [address] [phone]       Follow up with Gaye Pollack, MD On 01/21/2014. (PA/LAT CXR to be taken (at [facility] which is in the same building as Dr. Vivi Martens office) on 01/21/2014 at 3:00 pm;Appointment time is at 4:00 pm)  Specialty:  Cardiothoracic Surgery   Contact information:   [address] [place] [address] [phone]       Signed: Lars May MPA-C 12/23/2013, 12:32 PM

## 2013-12-22 NOTE — Discharge Instructions (Signed)
Activity: 1.May walk up steps °               2.No lifting more than ten pounds for four weeks.  °               3.No driving for four weeks. °               4.Stop any activity that causes chest pain, shortness of breath, dizziness, sweating or excessive weakness. °               5.Avoid straining. °               6.Continue with your breathing exercises daily. ° °Diet:  Low fat, Low salt diet ° °Wound Care: May shower.  Clean wounds with mild soap and water daily. Contact the office at [phone] if any problems arise. ° °Coronary Artery Bypass Grafting, Care After °Refer to this sheet in the next few weeks. These instructions provide you with information on caring for yourself after your procedure. Your health care provider may also give you more specific instructions. Your treatment has been planned according to current medical practices, but problems sometimes occur. Call your health care provider if you have any problems or questions after your procedure. °WHAT TO EXPECT AFTER THE PROCEDURE °Recovery from surgery will be different for everyone. Some people feel well after 3 or 4 weeks, while for others it takes longer. After your procedure, it is typical to have the following: °· Nausea and a lack of appetite.   °· Constipation. °· Weakness and fatigue.   °· Depression or irritability.   °· Pain or discomfort at your incision site. °HOME CARE INSTRUCTIONS °· Take all medicines as directed by your health care provider. Do not stop taking medicines or start any new medicines without first checking with your health care provider. °· Take your pulse as directed by your health care provider. °· Perform deep breathing as directed by your health care provider. If you were given a device called an incentive spirometer, use it to practice deep breathing several times a day. Support your chest with a pillow or your arms when you take deep breaths or cough. °· Keep incision areas clean, dry, and protected. Remove or  change any bandages (dressings) only as directed by your health care provider. You may have skin adhesive strips over the incision areas. Do not take the strips off. They will fall off on their own. °· Check incision areas daily for any swelling, redness, or drainage. °· If incisions were made in your legs, do the following: °¨ Avoid crossing your legs.   °¨ Avoid sitting for long periods of time. Change positions every 30 minutes.   °¨ Elevate your legs when you are sitting. °· Wear compression stockings as directed by your health care provider. These stockings help keep blood clots from forming in your legs. °· Take showers once your health care provider approves. Until then, only take sponge baths. Pat incisions dry. Do not rub incisions with a washcloth or towel. Do not bathe, swim, or use a hot tub until directed by your health care provider. °· Eat foods that are high in fiber, such as raw fruits and vegetables, whole grains, beans, and nuts. Meats should be lean cut. Avoid canned, processed, and fried foods. °· Drink enough fluids to keep your urine clear or pale yellow. °· Weigh yourself every day. This helps identify if you are retaining fluid that may make your heart and lungs work harder. °·   Rest and limit activity as directed by your health care provider. You may be instructed to: °¨ Stop any activity at once if you have chest pain, shortness of breath, irregular heartbeats, or dizziness. Get help right away if you have any of these symptoms. °¨ Move around frequently for short periods or take short walks as directed by your health care provider. Increase your activities gradually. You may need physical therapy or cardiac rehabilitation to help strengthen your muscles and build your endurance. °¨ Avoid lifting, pushing, or pulling anything heavier than 10 lb (4.5 kg) for at least 6 weeks after surgery. °· Do not drive until your health care provider approves.  °· Ask your health care provider when you  may return to work. °· Ask your health care provider when you may resume sexual activity. °· Follow up with your health care provider as directed. °SEEK MEDICAL CARE IF: °· You have swelling, redness, increasing pain, or drainage at the site of an incision. °· You have a fever. °· You have swelling in your ankles or legs. °· You have pain in your legs.   °· You gain 2 or more pounds (0.9 kg) a day. °· You are nauseous or vomit. °· You have diarrhea.  °SEEK IMMEDIATE MEDICAL CARE IF: °· You have chest pain that goes to your jaw or arms. °· You have shortness of breath.   °· You have a fast or irregular heartbeat.   °· You notice a "clicking" in your breastbone (sternum) when you move.   °· You have numbness or weakness in your arms or legs. °· You feel dizzy or light-headed.   °MAKE SURE YOU: °· Understand these instructions. °· Will watch your condition. °· Will get help right away if you are not doing well or get worse. °Document Released: 12/09/2004 Document Revised: 05/27/2013 Document Reviewed: 10/29/2012 °Exit[employer]® Patient Information ©2015 [employer]. This information is not intended to replace advice given to you by your health care provider. Make sure you discuss any questions you have with your health care provider. ° °

## 2013-12-22 NOTE — Progress Notes (Addendum)
      [address]       [place] [address]             [phone]        4 Days Post-Op Procedure(s) (LRB): CORONARY ARTERY BYPASS GRAFTING (CABG) (N/A) INTRAOPERATIVE TRANSESOPHAGEAL ECHOCARDIOGRAM (N/A)  Subjective: Patient eating breakfast. Only complaint is constipation-given lactulose yesterday without results.  Objective: Vital signs in last 24 hours: Temp:  [97.8 F (36.6 C)-98.3 F (36.8 C)] 97.8 F (36.6 C) (07/20 0506) Pulse Rate:  [80-104] 80 (07/20 0506) Cardiac Rhythm:  [-] Normal sinus rhythm;Sinus tachycardia (07/19 1935) Resp:  [17-18] 18 (07/20 0506) BP: (97-107)/(51-72) 98/51 mmHg (07/20 0506) SpO2:  [92 %-95 %] 94 % (07/20 0506) Weight:  [223 lb 8.7 oz (101.4 kg)] 223 lb 8.7 oz (101.4 kg) (07/20 0506)  Pre op weight  117 kg Current Weight  12/22/13 223 lb 8.7 oz (101.4 kg)      Intake/Output from previous day: 07/19 0701 - 07/20 0700 In: 840 [P.O.:840] Out: 1150 [Urine:1150]   Physical Exam:  Cardiovascular: RRR, no murmurs, gallops, or rubs. Pulmonary: Clear to auscultation bilaterally; no rales, wheezes, or rhonchi. Abdomen: Soft, non tender, bowel sounds present. Extremities: Mild bilateral lower extremity edema. Wounds: Clean and dry.  No erythema or signs of infection.  Lab Results: CBC: Recent Labs  12/20/13 0535 12/21/13 0310  WBC 20.1* 16.1*  HGB 15.4 14.2  HCT 45.0 43.4  PLT 195 194   BMET:  Recent Labs  12/20/13 0535 12/21/13 0310  NA 140 140  K 4.4 4.6  CL 97 96  CO2 29 28  GLUCOSE 124* 88  BUN 14 16  CREATININE 0.97 1.00  CALCIUM 8.9 8.9    PT/INR:  Lab Results  Component Value Date   INR 1.33 12/18/2013   INR 1.17 12/18/2013   INR 0.99 12/18/2013   ABG:  INR: Will add last result for INR, ABG once components are confirmed Will add last 4 CBG results once components are confirmed  Assessment/Plan:  1. CV - SR in the 90's. On Lopressor 25 bid. BP labile this am-monitor. 2.   Pulmonary - Encourage incentive spirometer 3. Volume Overload - On Lasix 40 daily 4.  Acute blood loss anemia - H and H yesterday stable at 14.2 and 43.4 5. Remove EPW 6.LOC constipation 7. Likely discharge in am  ZIMMERMAN,DONIELLE MPA-C 12/22/2013,7:34 AM   Chart reviewed, patient examined, agree with above. His only complaint is no BM yet. Will give another dose of lactulose.

## 2013-12-23 MED ORDER — FLEET ENEMA 7-19 GM/118ML RE ENEM
1.0000 | ENEMA | Freq: Once | RECTAL | Status: AC
  Administered 2013-12-23: 1 via RECTAL
  Filled 2013-12-23: qty 1

## 2013-12-23 MED ORDER — PNEUMOCOCCAL VAC POLYVALENT 25 MCG/0.5ML IJ INJ
0.5000 mL | INJECTION | Freq: Once | INTRAMUSCULAR | Status: AC
  Administered 2013-12-23: 0.5 mL via INTRAMUSCULAR
  Filled 2013-12-23: qty 0.5

## 2013-12-23 MED ORDER — ATORVASTATIN CALCIUM 40 MG PO TABS: 40.0000 mg | ORAL_TABLET | Freq: Every day | ORAL | Status: DC

## 2013-12-23 MED ORDER — ASPIRIN 325 MG PO TBEC: 325.0000 mg | DELAYED_RELEASE_TABLET | Freq: Every day | ORAL | Status: DC

## 2013-12-23 MED ORDER — METOPROLOL TARTRATE 25 MG PO TABS: 25.0000 mg | ORAL_TABLET | Freq: Two times a day (BID) | ORAL | Status: DC

## 2013-12-23 MED ORDER — OXYCODONE HCL 5 MG PO TABS: 5.0000 mg | ORAL_TABLET | ORAL | Status: DC | PRN

## 2013-12-23 NOTE — Progress Notes (Signed)
[id number] Cardiac Rehab Completed discharge education with pt. He voices understanding. We discussed smoking cessation. I gave him tips for quitting, coaching contact number and quit smart class.He plans to use nicotine patch and Xanax if they will give him a prescription for it. Pt seems very motivated to quit. Pt agrees to [facility] in [place], will send referral.He has no insurance, no PCP and is worried about getting his medications. Will ask case manger to see. I gave pt financial form to fill out for [facility]. I placed going home after [facility] video on for him to watch. Beatrix Fetters, RN 12/23/2013 11:21 AM

## 2013-12-23 NOTE — Progress Notes (Signed)
      [address]       [place] [address]             [phone]        5 Days Post-Op Procedure(s) (LRB): CORONARY ARTERY BYPASS GRAFTING (CABG) (N/A) INTRAOPERATIVE TRANSESOPHAGEAL ECHOCARDIOGRAM (N/A)  Subjective: Patient's only complaint is no bowel movement yet and feels bloated.  Objective: Vital signs in last 24 hours: Temp:  [97.9 F (36.6 C)-98.5 F (36.9 C)] 98 F (36.7 C) (07/21 0627) Pulse Rate:  [77-90] 85 (07/21 0627) Cardiac Rhythm:  [-] Normal sinus rhythm (07/20 1920) Resp:  [18-20] 18 (07/21 0627) BP: (96-119)/(43-71) 119/68 mmHg (07/21 0627) SpO2:  [96 %] 96 % (07/21 0627) Weight:  [222 lb 7.1 oz (100.9 kg)] 222 lb 7.1 oz (100.9 kg) (07/21 0500)  Pre op weight  117 kg Current Weight  12/23/13 222 lb 7.1 oz (100.9 kg)      Intake/Output from previous day: 07/20 0701 - 07/21 0700 In: 600 [P.O.:600] Out: 2400 [Urine:2400]   Physical Exam:  Cardiovascular: RRR, no murmurs, gallops, or rubs. Pulmonary: Clear to auscultation bilaterally; no rales, wheezes, or rhonchi. Abdomen: Soft, non tender, some distention,bowel sounds present. Extremities: No lower extremity edema. Wounds: Clean and dry.  No erythema or signs of infection.  Lab Results: CBC:  Recent Labs  12/21/13 0310  WBC 16.1*  HGB 14.2  HCT 43.4  PLT 194   BMET:   Recent Labs  12/21/13 0310  NA 140  K 4.6  CL 96  CO2 28  GLUCOSE 88  BUN 16  CREATININE 1.00  CALCIUM 8.9    PT/INR:  Lab Results  Component Value Date   INR 1.33 12/18/2013   INR 1.17 12/18/2013   INR 0.99 12/18/2013   ABG:  INR: Will add last result for INR, ABG once components are confirmed Will add last 4 CBG results once components are confirmed  Assessment/Plan:  1. CV - SR in the 90's. On Lopressor 25 bid.  2.  Pulmonary - Encourage incentive spirometer 3. Volume Overload - On Lasix 40 daily 4.  Acute blood loss anemia - H and H yesterday stable at 14.2 and 43.4 5.  Still with constipation and no bowel movement. Enema today  Lee May MPA-C 12/23/2013,7:28 AM

## 2013-12-23 NOTE — Progress Notes (Signed)
Assessment unchanged. Discussed D/C instructions with pt including f/u appointments and new medications. Verbalized understanding. RX given to pt. IV and tele removed. Pt left with belongings via Wheelchair accompanied by NT.

## 2013-12-23 NOTE — Care Management Note (Signed)
  Page 1 of 1   12/23/2013     1:30:15 PM CARE MANAGEMENT NOTE 12/23/2013  Patient:  TAVARUS, MADRIAGA   Account Number:  [id number]  Date Initiated:  12/18/2013  Documentation initiated by:  [facility]  Subjective/Objective Assessment:   STEMI     Action/Plan:   Anticipated DC Date:  12/23/2013   Anticipated DC Plan:  HOME/SELF CARE      DC Planning Services  CM consult  Medication Assistance  [facility]      Choice offered to / List presented to:             Status of service:  Completed, signed off Medicare Important Message given?   (If response is "NO", the following Medicare IM given date fields will be blank) Date Medicare IM given:   Medicare IM given by:   Date Additional Medicare IM given:   Additional Medicare IM given by:    Discharge Disposition:  HOME/SELF CARE  Per UR Regulation:  Reviewed for med. necessity/level of care/duration of stay  If discussed at Long Length of Stay Meetings, dates discussed:    Comments:  Contact:  Lewey,Cindy Mother [phone] 7/21 1330 debbie Ovila Lepage rn,bsn  gave pt [facility] list. enc pt to go by there on the way home to get meds filled and get appt for pcp follow up. no ins at present.meds on 4.00 list at [facility] or [facility] except lipitor but it is in generic form and reasonable.

## 2013-12-25 ENCOUNTER — Ambulatory Visit: Payer: Self-pay | Attending: Internal Medicine | Admitting: Internal Medicine

## 2013-12-25 ENCOUNTER — Encounter: Payer: Self-pay | Admitting: Internal Medicine

## 2013-12-25 VITALS — BP 124/85 | HR 91 | Temp 97.9°F | Resp 16 | Ht 72.0 in | Wt 227.0 lb

## 2013-12-25 DIAGNOSIS — Z951 Presence of aortocoronary bypass graft: Secondary | ICD-10-CM

## 2013-12-25 DIAGNOSIS — R143 Flatulence: Secondary | ICD-10-CM

## 2013-12-25 DIAGNOSIS — F17200 Nicotine dependence, unspecified, uncomplicated: Secondary | ICD-10-CM | POA: Insufficient documentation

## 2013-12-25 DIAGNOSIS — Z79899 Other long term (current) drug therapy: Secondary | ICD-10-CM | POA: Insufficient documentation

## 2013-12-25 DIAGNOSIS — R110 Nausea: Secondary | ICD-10-CM | POA: Insufficient documentation

## 2013-12-25 DIAGNOSIS — E785 Hyperlipidemia, unspecified: Secondary | ICD-10-CM

## 2013-12-25 DIAGNOSIS — I10 Essential (primary) hypertension: Secondary | ICD-10-CM

## 2013-12-25 DIAGNOSIS — R109 Unspecified abdominal pain: Secondary | ICD-10-CM | POA: Insufficient documentation

## 2013-12-25 DIAGNOSIS — Z139 Encounter for screening, unspecified: Secondary | ICD-10-CM

## 2013-12-25 DIAGNOSIS — I2589 Other forms of chronic ischemic heart disease: Secondary | ICD-10-CM | POA: Insufficient documentation

## 2013-12-25 DIAGNOSIS — R140 Abdominal distension (gaseous): Secondary | ICD-10-CM

## 2013-12-25 DIAGNOSIS — R141 Gas pain: Secondary | ICD-10-CM | POA: Insufficient documentation

## 2013-12-25 DIAGNOSIS — Z7982 Long term (current) use of aspirin: Secondary | ICD-10-CM | POA: Insufficient documentation

## 2013-12-25 DIAGNOSIS — R142 Eructation: Secondary | ICD-10-CM

## 2013-12-25 DIAGNOSIS — I2510 Atherosclerotic heart disease of native coronary artery without angina pectoris: Secondary | ICD-10-CM | POA: Insufficient documentation

## 2013-12-25 DIAGNOSIS — K5900 Constipation, unspecified: Secondary | ICD-10-CM | POA: Insufficient documentation

## 2013-12-25 MED ORDER — SIMETHICONE 80 MG PO CHEW: 80.0000 mg | CHEWABLE_TABLET | Freq: Four times a day (QID) | ORAL | Status: DC | PRN

## 2013-12-25 NOTE — Patient Instructions (Signed)
Fat and Cholesterol Control Diet Fat and cholesterol levels in your blood and organs are influenced by your diet. High levels of fat and cholesterol may lead to diseases of the heart, small and large blood vessels, gallbladder, liver, and pancreas. CONTROLLING FAT AND CHOLESTEROL WITH DIET Although exercise and lifestyle factors are important, your diet is key. That is because certain foods are known to raise cholesterol and others to lower it. The goal is to balance foods for their effect on cholesterol and more importantly, to replace saturated and trans fat with other types of fat, such as monounsaturated fat, polyunsaturated fat, and omega-3 fatty acids. On average, a person should consume no more than 15 to 17 g of saturated fat daily. Saturated and trans fats are considered "bad" fats, and they will raise LDL cholesterol. Saturated fats are primarily found in animal products such as meats, butter, and cream. However, that does not mean you need to give up all your favorite foods. Today, there are good tasting, low-fat, low-cholesterol substitutes for most of the things you like to eat. Choose low-fat or nonfat alternatives. Choose round or loin cuts of red meat. These types of cuts are lowest in fat and cholesterol. Chicken (without the skin), fish, veal, and ground turkey breast are great choices. Eliminate fatty meats, such as hot dogs and salami. Even shellfish have little or no saturated fat. Have a 3 oz (85 g) portion when you eat lean meat, poultry, or fish. Trans fats are also called "partially hydrogenated oils." They are oils that have been scientifically manipulated so that they are solid at room temperature resulting in a longer shelf life and improved taste and texture of foods in which they are added. Trans fats are found in stick margarine, some tub margarines, cookies, crackers, and baked goods.  When baking and cooking, oils are a great substitute for butter. The monounsaturated oils are  especially beneficial since it is believed they lower LDL and raise HDL. The oils you should avoid entirely are saturated tropical oils, such as coconut and palm.  Remember to eat a lot from food groups that are naturally free of saturated and trans fat, including fish, fruit, vegetables, beans, grains (barley, rice, couscous, bulgur wheat), and pasta (without cream sauces).  IDENTIFYING FOODS THAT LOWER FAT AND CHOLESTEROL  Soluble fiber may lower your cholesterol. This type of fiber is found in fruits such as apples, vegetables such as broccoli, potatoes, and carrots, legumes such as beans, peas, and lentils, and grains such as barley. Foods fortified with plant sterols (phytosterol) may also lower cholesterol. You should eat at least 2 g per day of these foods for a cholesterol lowering effect.  Read package labels to identify low-saturated fats, trans fat free, and low-fat foods at the supermarket. Select cheeses that have only 2 to 3 g saturated fat per ounce. Use a heart-healthy tub margarine that is free of trans fats or partially hydrogenated oil. When buying baked goods (cookies, crackers), avoid partially hydrogenated oils. Breads and muffins should be made from whole grains (whole-wheat or whole oat flour, instead of "flour" or "enriched flour"). Buy non-creamy canned soups with reduced salt and no added fats.  FOOD PREPARATION TECHNIQUES  Never deep-fry. If you must fry, either stir-fry, which uses very little fat, or use non-stick cooking sprays. When possible, broil, bake, or roast meats, and steam vegetables. Instead of putting butter or margarine on vegetables, use lemon and herbs, applesauce, and cinnamon (for squash and sweet potatoes). Use nonfat   yogurt, salsa, and low-fat dressings for salads.  LOW-SATURATED FAT / LOW-FAT FOOD SUBSTITUTES Meats / Saturated Fat (g)  Avoid: Steak, marbled (3 oz/85 g) / 11 g  Choose: Steak, lean (3 oz/85 g) / 4 g  Avoid: Hamburger (3 oz/85 g) / 7  g  Choose: Hamburger, lean (3 oz/85 g) / 5 g  Avoid: Ham (3 oz/85 g) / 6 g  Choose: Ham, lean cut (3 oz/85 g) / 2.4 g  Avoid: Chicken, with skin, dark meat (3 oz/85 g) / 4 g  Choose: Chicken, skin removed, dark meat (3 oz/85 g) / 2 g  Avoid: Chicken, with skin, light meat (3 oz/85 g) / 2.5 g  Choose: Chicken, skin removed, light meat (3 oz/85 g) / 1 g Dairy / Saturated Fat (g)  Avoid: Whole milk (1 cup) / 5 g  Choose: Low-fat milk, 2% (1 cup) / 3 g  Choose: Low-fat milk, 1% (1 cup) / 1.5 g  Choose: Skim milk (1 cup) / 0.3 g  Avoid: Hard cheese (1 oz/28 g) / 6 g  Choose: Skim milk cheese (1 oz/28 g) / 2 to 3 g  Avoid: Cottage cheese, 4% fat (1 cup) / 6.5 g  Choose: Low-fat cottage cheese, 1% fat (1 cup) / 1.5 g  Avoid: Ice cream (1 cup) / 9 g  Choose: Sherbet (1 cup) / 2.5 g  Choose: Nonfat frozen yogurt (1 cup) / 0.3 g  Choose: Frozen fruit bar / trace  Avoid: Whipped cream (1 tbs) / 3.5 g  Choose: Nondairy whipped topping (1 tbs) / 1 g Condiments / Saturated Fat (g)  Avoid: Mayonnaise (1 tbs) / 2 g  Choose: Low-fat mayonnaise (1 tbs) / 1 g  Avoid: Butter (1 tbs) / 7 g  Choose: Extra light margarine (1 tbs) / 1 g  Avoid: Coconut oil (1 tbs) / 11.8 g  Choose: Olive oil (1 tbs) / 1.8 g  Choose: Corn oil (1 tbs) / 1.7 g  Choose: Safflower oil (1 tbs) / 1.2 g  Choose: Sunflower oil (1 tbs) / 1.4 g  Choose: Soybean oil (1 tbs) / 2.4 g  Choose: Canola oil (1 tbs) / 1 g Document Released: 05/22/2005 Document Revised: 09/16/2012 Document Reviewed: 08/20/2013 [employer] Patient Information 2015 [employer]. This information is not intended to replace advice given to you by your health care provider. Make sure you discuss any questions you have with your health care provider.  

## 2013-12-25 NOTE — Progress Notes (Signed)
Patient Demographics  Lee GuerinWilliam May, is a 53 y.o. male  [id number]  [id number]  DOB - [birth year]  CC:  Chief Complaint  Patient presents with  . Hospitalization Follow-up  . Code STEMI    S/p cabg x5   . Constipation       HPI: Lee May is a 53 y.o. male here today to establish medical care.He history of CAD had PCI with stent several years ago, recently hospitalized with symptoms of chest pain, EMR reviewed patient had ST segment elevation MI patient underwent cardiac cath after that  patient underwent emergent CABG on July 16, patient still has chest tube sutures and is scheduled to follow with cardiothoracic surgery, currently patient is taking aspirin statin beta blocker, pain medication when necessary, patient complaining of lot of bloating symptoms, he took a milk of magnesia yesterday and had some bowel movement. Patient has No headache, No chest pain, No abdominal pain - No Nausea, No new weakness tingling or numbness, No Cough - SOB.  No Known Allergies Past Medical History  Diagnosis Date  . MI (myocardial infarction)   . CAD (coronary artery disease), native coronary artery   . ST elevation (STEMI) myocardial infarction involving left anterior descending coronary artery 12/18/2013    Cath: early mLAD 100% after mod D1, mRCA 100% (L-R collaterals), Om1 80% ostial --> PTCA of LAD --> CABG x 5 ; Echo: EF 35-40% - hypokinesis of inferior, inferoseptal and distal posterior walls; apical dyskinesis  . 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 Laneta SimmersBartle): LIMA-LAD, SVG-rAM, SeqSVG-RPDA-PL, SVG-OM  . Heavy smoker     3PPD  . Essential hypertension   . Dyslipidemia, goal LDL below 70    Current Outpatient Prescriptions on File Prior to Visit  Medication Sig Dispense Refill  . aspirin EC 325 MG EC tablet Take 1 tablet (325 mg total) by mouth daily.  30 tablet  0  . atorvastatin (LIPITOR) 40 MG tablet Take 1 tablet (40 mg total) by mouth daily at 6 PM.  30 tablet  1  .  metoprolol tartrate (LOPRESSOR) 25 MG tablet Take 1 tablet (25 mg total) by mouth 2 (two) times daily.  60 tablet  1  . oxyCODONE (OXY IR/ROXICODONE) 5 MG immediate release tablet Take 1-2 tablets (5-10 mg total) by mouth every 4 (four) hours as needed for severe pain.  30 tablet  0   No current facility-administered medications on file prior to visit.   Family History  Problem Relation Age of Onset  . Cancer Other   . CAD Other   . Cancer Father   . Hypertension Father   . Stroke Father   . Heart disease Mother    History   Social History  . Marital Status: Divorced    Spouse Name: N/A    Number of Children: N/A  . Years of Education: N/A   Occupational History  . Not on file.   Social History Main Topics  . Smoking status: Current Every Day Smoker -- 3.00 packs/day  . Smokeless tobacco: Not on file     Comment: states he quit smoking 12/17/13 but was seen smoking outside clinic  . Alcohol Use: No  . Drug Use: No  . Sexual Activity: Not on file   Other Topics Concern  . Not on file   Social History Narrative   Divorced Plumber   Prior to STEMI 2-3  PPD smoker    Review of Systems: Constitutional: Negative for fever, chills,  diaphoresis, activity change, appetite change and fatigue. HENT: Negative for ear pain, nosebleeds, congestion, facial swelling, rhinorrhea, neck pain, neck stiffness and ear discharge.  Eyes: Negative for pain, discharge, redness, itching and visual disturbance. Respiratory: Negative for cough, choking, chest tightness, shortness of breath, wheezing and stridor.  Cardiovascular: Negative for chest pain, palpitations and leg swelling. Gastrointestinal: Negative for abdominal distention. Genitourinary: Negative for dysuria, urgency, frequency, hematuria, flank pain, decreased urine volume, difficulty urinating and dyspareunia.  Musculoskeletal: Negative for back pain, joint swelling, arthralgia and gait problem. Neurological: Negative for  dizziness, tremors, seizures, syncope, facial asymmetry, speech difficulty, weakness, light-headedness, numbness and headaches.  Hematological: Negative for adenopathy. Does not bruise/bleed easily. Psychiatric/Behavioral: Negative for hallucinations, behavioral problems, confusion, dysphoric mood, decreased concentration and agitation.    Objective:   Filed Vitals:   12/25/13 0956  BP: 124/85  Pulse: 91  Temp: 97.9 F (36.6 C)  Resp: 16    Physical Exam: Constitutional: Patient appears well-developed and well-nourished. No distress. HENT: Normocephalic, atraumatic, External right and left ear normal. Oropharynx is clear and moist.  Eyes: Conjunctivae and EOM are normal. PERRLA, no scleral icterus. Neck: Normal ROM. Neck supple. No JVD. No tracheal deviation. No thyromegaly. CVS: RRR, S1/S2 +, no murmurs, no gallops, no carotid bruit.  Pulmonary: Effort and breath sounds normal, no stridor, rhonchi, wheezes, rales.  Abdominal: Soft. BS +, no distension, tenderness, rebound or guarding. Increased bowel sounds.  Musculoskeletal: Normal range of motion. No edema and no tenderness. Neuro: Alert. Normal reflexes, muscle tone coordination. No cranial nerve deficit. Skin: Patient has sutures on the chest wall nontender no apparent discharge.  Psychiatric: Normal mood and affect. Behavior, judgment, thought content normal.  Lab Results  Component Value Date   WBC 16.1* 12/21/2013   HGB 14.2 12/21/2013   HCT 43.4 12/21/2013   MCV 91.9 12/21/2013   PLT 194 12/21/2013   Lab Results  Component Value Date   CREATININE 1.00 12/21/2013   BUN 16 12/21/2013   NA 140 12/21/2013   K 4.6 12/21/2013   CL 96 12/21/2013   CO2 28 12/21/2013    Lab Results  Component Value Date   HGBA1C 5.6 12/18/2013   Lipid Panel  No results found for this basename: chol, trig, hdl, cholhdl, vldl, ldlcalc       Assessment and plan:   1. Essential hypertension Continue with metoprolol, will check blood  chemistry - COMPLETE METABOLIC PANEL WITH GFR; Future  2. S/P CABG x 5 Denies any chest and has followup with her cardiothoracic surgeon  3. Dyslipidemia, goal LDL below 70 Currently on Lipitor 40 mg will check lipid panel - Lipid panel; Future  4. Bloating symptom Trial of - simethicone (GAS-X) 80 MG chewable tablet; Chew 1 tablet (80 mg total) by mouth every 6 (six) hours as needed for flatulence.  Dispense: 30 tablet; Refill: 0  5. Screening  - TSH; Future - Vit D  25 hydroxy (rtn osteoporosis monitoring); Future - CBC with Differential; Future  Return in about 3 months (around 03/27/2014) for CAD, hypertension.  Doris Cheadle, MD

## 2013-12-25 NOTE — Progress Notes (Signed)
Pt here HFU- STEMI s/p CABG x5 D/c'd 12/17/13 mid sternal incision well approx,intact with old bloody drainage. 3 puncture site with sutures intact States he had liquid BM yesterday and today with abdominal cramps and feeling nauseated. Pt was given enema before d/c with positive effect Denies vomiting or chest pain Using incentive spirometer effectively Right leg graft site intact as well

## 2013-12-29 ENCOUNTER — Other Ambulatory Visit: Payer: Self-pay | Admitting: *Deleted

## 2013-12-29 ENCOUNTER — Encounter (INDEPENDENT_AMBULATORY_CARE_PROVIDER_SITE_OTHER): Payer: Self-pay

## 2013-12-29 DIAGNOSIS — I2510 Atherosclerotic heart disease of native coronary artery without angina pectoris: Secondary | ICD-10-CM

## 2013-12-29 DIAGNOSIS — G8918 Other acute postprocedural pain: Secondary | ICD-10-CM

## 2013-12-29 MED ORDER — OXYCODONE HCL 5 MG PO TABS: 5.0000 mg | ORAL_TABLET | ORAL | Status: DC | PRN

## 2014-01-01 ENCOUNTER — Emergency Department (HOSPITAL_COMMUNITY): Payer: Self-pay

## 2014-01-01 ENCOUNTER — Encounter (HOSPITAL_COMMUNITY): Payer: Self-pay | Admitting: Emergency Medicine

## 2014-01-01 ENCOUNTER — Inpatient Hospital Stay (HOSPITAL_COMMUNITY)
Admission: EM | Admit: 2014-01-01 | Discharge: 2014-01-03 | DRG: 384 | Disposition: A | Discharge status: Home / Self Care | Payer: Self-pay | Attending: Internal Medicine | Admitting: Internal Medicine

## 2014-01-01 DIAGNOSIS — Z7982 Long term (current) use of aspirin: Secondary | ICD-10-CM

## 2014-01-01 DIAGNOSIS — K253 Acute gastric ulcer without hemorrhage or perforation: Principal | ICD-10-CM | POA: Diagnosis present

## 2014-01-01 DIAGNOSIS — R110 Nausea: Secondary | ICD-10-CM

## 2014-01-01 DIAGNOSIS — I219 Acute myocardial infarction, unspecified: Secondary | ICD-10-CM | POA: Diagnosis present

## 2014-01-01 DIAGNOSIS — I2102 ST elevation (STEMI) myocardial infarction involving left anterior descending coronary artery: Secondary | ICD-10-CM | POA: Diagnosis present

## 2014-01-01 DIAGNOSIS — A048 Other specified bacterial intestinal infections: Secondary | ICD-10-CM | POA: Diagnosis present

## 2014-01-01 DIAGNOSIS — R1013 Epigastric pain: Secondary | ICD-10-CM

## 2014-01-01 DIAGNOSIS — Z951 Presence of aortocoronary bypass graft: Secondary | ICD-10-CM

## 2014-01-01 DIAGNOSIS — I509 Heart failure, unspecified: Secondary | ICD-10-CM | POA: Diagnosis present

## 2014-01-01 DIAGNOSIS — R52 Pain, unspecified: Secondary | ICD-10-CM

## 2014-01-01 DIAGNOSIS — Z9861 Coronary angioplasty status: Secondary | ICD-10-CM

## 2014-01-01 DIAGNOSIS — I2510 Atherosclerotic heart disease of native coronary artery without angina pectoris: Secondary | ICD-10-CM | POA: Diagnosis present

## 2014-01-01 DIAGNOSIS — F17200 Nicotine dependence, unspecified, uncomplicated: Secondary | ICD-10-CM | POA: Diagnosis present

## 2014-01-01 DIAGNOSIS — E785 Hyperlipidemia, unspecified: Secondary | ICD-10-CM | POA: Diagnosis present

## 2014-01-01 DIAGNOSIS — I10 Essential (primary) hypertension: Secondary | ICD-10-CM

## 2014-01-01 LAB — URINALYSIS, ROUTINE W REFLEX MICROSCOPIC
GLUCOSE, UA: NEGATIVE mg/dL
Hgb urine dipstick: NEGATIVE
Ketones, ur: 40 mg/dL — AB
Leukocytes, UA: NEGATIVE
Nitrite: NEGATIVE
PH: 6 (ref 5.0–8.0)
Protein, ur: NEGATIVE mg/dL
Specific Gravity, Urine: 1.025 (ref 1.005–1.030)
Urobilinogen, UA: 1 mg/dL (ref 0.0–1.0)

## 2014-01-01 LAB — CBC WITH DIFFERENTIAL/PLATELET
Basophils Absolute: 0 10*3/uL (ref 0.0–0.1)
Basophils Relative: 0 % (ref 0–1)
Eosinophils Absolute: 0.2 10*3/uL (ref 0.0–0.7)
Eosinophils Relative: 1 % (ref 0–5)
HCT: 38.4 % — ABNORMAL LOW (ref 39.0–52.0)
Hemoglobin: 13.1 g/dL (ref 13.0–17.0)
LYMPHS ABS: 1.6 10*3/uL (ref 0.7–4.0)
Lymphocytes Relative: 13 % (ref 12–46)
MCH: 30.3 pg (ref 26.0–34.0)
MCHC: 34.1 g/dL (ref 30.0–36.0)
MCV: 88.9 fL (ref 78.0–100.0)
MONOS PCT: 6 % (ref 3–12)
Monocytes Absolute: 0.7 10*3/uL (ref 0.1–1.0)
NEUTROS ABS: 9.6 10*3/uL — AB (ref 1.7–7.7)
NEUTROS PCT: 80 % — AB (ref 43–77)
Platelets: 609 10*3/uL — ABNORMAL HIGH (ref 150–400)
RBC: 4.32 MIL/uL (ref 4.22–5.81)
RDW: 13.4 % (ref 11.5–15.5)
WBC: 12.1 10*3/uL — ABNORMAL HIGH (ref 4.0–10.5)

## 2014-01-01 LAB — COMPREHENSIVE METABOLIC PANEL
ALK PHOS: 115 U/L (ref 39–117)
ALT: 38 U/L (ref 0–53)
ANION GAP: 17 — AB (ref 5–15)
AST: 17 U/L (ref 0–37)
Albumin: 3 g/dL — ABNORMAL LOW (ref 3.5–5.2)
BILIRUBIN TOTAL: 0.3 mg/dL (ref 0.3–1.2)
BUN: 12 mg/dL (ref 6–23)
CHLORIDE: 99 meq/L (ref 96–112)
CO2: 23 mEq/L (ref 19–32)
Calcium: 9.2 mg/dL (ref 8.4–10.5)
Creatinine, Ser: 0.91 mg/dL (ref 0.50–1.35)
GFR calc Af Amer: >90 mL/min (ref >90)
GFR calc non Af Amer: >90 mL/min (ref >90)
GLUCOSE: 88 mg/dL (ref 70–99)
POTASSIUM: 4 meq/L (ref 3.7–5.3)
Sodium: 139 mEq/L (ref 137–147)
Total Protein: 7.6 g/dL (ref 6.0–8.3)

## 2014-01-01 LAB — I-STAT TROPONIN, ED: Troponin i, poc: 0.02 ng/mL (ref 0.00–0.08)

## 2014-01-01 LAB — LIPASE, BLOOD: LIPASE: 34 U/L (ref 11–59)

## 2014-01-01 MED ORDER — ONDANSETRON HCL 4 MG/2ML IJ SOLN: 4.0000 mg | Freq: Four times a day (QID) | INTRAMUSCULAR | Status: DC | PRN

## 2014-01-01 MED ORDER — SODIUM CHLORIDE 0.9 % IV SOLN
INTRAVENOUS | Status: AC
  Administered 2014-01-01: via INTRAVENOUS

## 2014-01-01 MED ORDER — PROMETHAZINE HCL 25 MG/ML IJ SOLN
25.0000 mg | Freq: Once | INTRAMUSCULAR | Status: AC
  Administered 2014-01-01: 25 mg via INTRAVENOUS
  Filled 2014-01-01: qty 1

## 2014-01-01 MED ORDER — PANTOPRAZOLE SODIUM 40 MG IV SOLR
40.0000 mg | Freq: Two times a day (BID) | INTRAVENOUS | Status: AC
  Administered 2014-01-02 (×3): 40 mg via INTRAVENOUS
  Filled 2014-01-01 (×4): qty 40

## 2014-01-01 MED ORDER — IOHEXOL 300 MG/ML  SOLN
25.0000 mL | Freq: Once | INTRAMUSCULAR | Status: AC | PRN
  Administered 2014-01-01: 25 mL via ORAL

## 2014-01-01 MED ORDER — IOHEXOL 300 MG/ML  SOLN
100.0000 mL | Freq: Once | INTRAMUSCULAR | Status: AC | PRN
  Administered 2014-01-01: 100 mL via INTRAVENOUS

## 2014-01-01 MED ORDER — SODIUM CHLORIDE 0.9 % IV BOLUS (SEPSIS)
1000.0000 mL | Freq: Once | INTRAVENOUS | Status: AC
  Administered 2014-01-01: 1000 mL via INTRAVENOUS

## 2014-01-01 MED ORDER — PANTOPRAZOLE SODIUM 40 MG IV SOLR
40.0000 mg | Freq: Once | INTRAVENOUS | Status: AC
  Administered 2014-01-01: 40 mg via INTRAVENOUS
  Filled 2014-01-01: qty 40

## 2014-01-01 MED ORDER — ONDANSETRON HCL 4 MG/2ML IJ SOLN
4.0000 mg | Freq: Once | INTRAMUSCULAR | Status: AC
  Administered 2014-01-01: 4 mg via INTRAVENOUS
  Filled 2014-01-01: qty 2

## 2014-01-01 MED ORDER — ONDANSETRON HCL 4 MG PO TABS: 4.0000 mg | ORAL_TABLET | Freq: Four times a day (QID) | ORAL | Status: DC | PRN

## 2014-01-01 MED ORDER — MORPHINE SULFATE 4 MG/ML IJ SOLN
4.0000 mg | Freq: Once | INTRAMUSCULAR | Status: AC
  Administered 2014-01-01: 4 mg via INTRAVENOUS
  Filled 2014-01-01: qty 1

## 2014-01-01 MED ORDER — HYDROMORPHONE HCL PF 1 MG/ML IJ SOLN
0.5000 mg | INTRAMUSCULAR | Status: DC | PRN
  Administered 2014-01-02 (×3): 0.5 mg via INTRAVENOUS
  Filled 2014-01-01 (×3): qty 1

## 2014-01-01 NOTE — ED Notes (Signed)
MI and CABG on 7/16, today began having nausea, abdominal pain and distention. Unable to have BM with use of milk of mag. Had loose BM 2 days ago. Pt is pale. Clammy. Denies chest pain, denies SOB.

## 2014-01-01 NOTE — ED Provider Notes (Signed)
CSN: [id number]     Arrival date & time 01/01/14  1617 History   First MD Initiated Contact with Patient 01/01/14 1647     Chief Complaint  Patient presents with  . Nausea     (Consider location/radiation/quality/duration/timing/severity/associated sxs/prior Treatment) Patient is a 53 y.o. male presenting with general illness. The history is provided by the patient.  Illness Severity:  Mild Onset quality:  Gradual Duration:  2 weeks Timing:  Constant Progression:  Worsening Chronicity:  New Associated symptoms: nausea   Associated symptoms: no abdominal pain, no chest pain, no congestion, no diarrhea, no fever, no headaches, no myalgias, no rash, no shortness of breath and no vomiting     Patient is a 53 year old male with a significant past medical history of a CABG done approximately 14 days ago. Patient presents with nausea weakness inability to eat for the past 3-4 days. Patient has been constipated since his surgery. Patient has tried some stool softeners without relief. Patient used milk of magnesia 3 days ago and had a loose bowel movement. Since then has not had any stool. Patient denies flatus the past couple days.  Past Medical History  Diagnosis Date  . MI (myocardial infarction)   . CAD (coronary artery disease), native coronary artery   . ST elevation (STEMI) myocardial infarction involving left anterior descending coronary artery 12/18/2013    Cath: early mLAD 100% after mod D1, mRCA 100% (L-R collaterals), Om1 80% ostial --> PTCA of LAD --> CABG x 5 ; Echo: EF 35-40% - hypokinesis of inferior, inferoseptal and distal posterior walls; apical dyskinesis  . 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 Laneta Simmers): LIMA-LAD, SVG-rAM, SeqSVG-RPDA-PL, SVG-OM  . Heavy smoker     3PPD  . Essential hypertension   . Dyslipidemia, goal LDL below 70   . CHF (congestive heart failure)    Past Surgical History  Procedure Laterality Date  . Coronary angioplasty with stent placement    . Fracture  surgery    . Coronary artery bypass graft N/A 12/18/2013    Procedure: CORONARY ARTERY BYPASS GRAFTING (CABG);  Surgeon: Alleen Borne, MD;  Location: [facility] OR;  Service: Open Heart Surgery;  Laterality: N/A;  . Intraoperative transesophageal echocardiogram N/A 12/18/2013    Procedure: INTRAOPERATIVE TRANSESOPHAGEAL ECHOCARDIOGRAM;  Surgeon: Alleen Borne, MD;  Location: [facility] OR;  Service: Open Heart Surgery;  Laterality: N/A;   Family History  Problem Relation Age of Onset  . Cancer Other   . CAD Other   . Cancer Father   . Hypertension Father   . Stroke Father   . Heart disease Mother    History  Substance Use Topics  . Smoking status: Current Every Day Smoker -- 3.00 packs/day  . Smokeless tobacco: Not on file     Comment: states he quit smoking 12/17/13 but was seen smoking outside clinic  . Alcohol Use: No    Review of Systems  Constitutional: Negative for fever and chills.  HENT: Negative for congestion and facial swelling.   Eyes: Negative for discharge and visual disturbance.  Respiratory: Negative for shortness of breath.   Cardiovascular: Negative for chest pain and palpitations.  Gastrointestinal: Positive for nausea and constipation. Negative for vomiting, abdominal pain and diarrhea.  Musculoskeletal: Negative for arthralgias and myalgias.  Skin: Negative for color change and rash.  Neurological: Negative for tremors, syncope and headaches.  Psychiatric/Behavioral: Negative for confusion and dysphoric mood.      Allergies  Review of patient's allergies indicates no  known allergies.  Home Medications   Prior to Admission medications   Medication Sig Start Date End Date Taking? Authorizing Provider  aspirin EC 325 MG EC tablet Take 1 tablet (325 mg total) by mouth daily. 12/23/13  Yes Donielle Margaretann Loveless, PA-C  atorvastatin (LIPITOR) 40 MG tablet Take 1 tablet (40 mg total) by mouth daily at 6 PM. 12/23/13  Yes Donielle Margaretann Loveless, PA-C  docusate sodium (COLACE)  100 MG capsule Take 100 mg by mouth daily.   Yes Historical Provider, MD  magnesium hydroxide (MILK OF MAGNESIA) 400 MG/5ML suspension Take 30 mLs by mouth daily as needed for moderate constipation.   Yes Historical Provider, MD  metoprolol tartrate (LOPRESSOR) 25 MG tablet Take 1 tablet (25 mg total) by mouth 2 (two) times daily. 12/23/13  Yes Donielle Margaretann Loveless, PA-C  Misc Natural Products (LAXATIVE FORMULA) TABS Take 1 tablet by mouth daily as needed (for constipation).   Yes Historical Provider, MD  oxyCODONE (OXY IR/ROXICODONE) 5 MG immediate release tablet Take 1-2 tablets (5-10 mg total) by mouth every 4 (four) hours as needed for severe pain. 12/29/13  Yes Purcell Nails, MD   BP 124/65  Pulse 82  Temp(Src) 98.2 F (36.8 C) (Oral)  Resp 22  SpO2 93% Physical Exam  Constitutional: He is oriented to person, place, and time. He appears well-developed and well-nourished.  HENT:  Head: Normocephalic and atraumatic.  Eyes: EOM are normal. Pupils are equal, round, and reactive to light.  Neck: Normal range of motion. Neck supple. No JVD present.  Cardiovascular: Normal rate and regular rhythm.  Exam reveals no gallop and no friction rub.   No murmur heard. Pulmonary/Chest: No respiratory distress. He has no wheezes.  Abdominal: He exhibits no distension. Bowel sounds are decreased. There is no rebound and no guarding.  Genitourinary: Rectum normal. Rectal exam shows anal tone normal.  No stool in the vault  Musculoskeletal: Normal range of motion.  Neurological: He is alert and oriented to person, place, and time.  Skin: No rash noted. No pallor.  Psychiatric: He has a normal mood and affect. His behavior is normal.    ED Course  Procedures (including critical care time) Labs Review Labs Reviewed  CBC WITH DIFFERENTIAL - Abnormal; Notable for the following:    WBC 12.1 (*)    HCT 38.4 (*)    Platelets 609 (*)    Neutrophils Relative % 80 (*)    Neutro Abs 9.6 (*)    All  other components within normal limits  COMPREHENSIVE METABOLIC PANEL - Abnormal; Notable for the following:    Albumin 3.0 (*)    Anion gap 17 (*)    All other components within normal limits  URINALYSIS, ROUTINE W REFLEX MICROSCOPIC - Abnormal; Notable for the following:    Bilirubin Urine SMALL (*)    Ketones, ur 40 (*)    All other components within normal limits  LIPASE, BLOOD  I-STAT TROPOININ, ED    Imaging Review Dg Chest 2 View  01/01/2014   CLINICAL DATA:  Nausea, abdominal pain, and distention. Recent myocardial infarction and CABG.  EXAM: CHEST  2 VIEW  COMPARISON:  12/20/2013 and 09/08/2012  FINDINGS: Heart size and pulmonary vascularity are normal. Minimal residual atelectasis at the left lung base. Small effusion has completely resolved. Right lung is clear. No osseous abnormality.  IMPRESSION: Minimal residual atelectasis at the left lung base.   Electronically Signed   By: Geanie Cooley M.D.   On: 01/01/2014  18:42   Ct Angio Chest Pe W/cm &/or Wo Cm  01/01/2014   CLINICAL DATA:  MI and CABG on 12/18/2013. Today began having nausea, abdominal pain, and distention. Hypoxia.  EXAM: CT ANGIOGRAPHY CHEST WITH CONTRAST  TECHNIQUE: Multidetector CT imaging of the chest was performed using the standard protocol during bolus administration of intravenous contrast. Multiplanar CT image reconstructions and MIPs were obtained to evaluate the vascular anatomy.  CONTRAST:  100 mL Omnipaque 300  COMPARISON:  Chest 01/01/2014  FINDINGS: Technically adequate study with good opacification of the central and segmental pulmonary arteries. No focal filling defects are demonstrated. No evidence of significant pulmonary embolus.  Normal heart size. Normal caliber thoracic aorta. No evidence of dissection. Postoperative changes in the mediastinum consistent with Coronary bypass graft. Esophagus is decompressed. No significant lymphadenopathy in the chest.  Mild dependent atelectasis in the lung bases. No  focal airspace disease or consolidation. No pleural effusion. No pneumothorax. No destructive bone lesions.  Review of the MIP images confirms the above findings.  IMPRESSION: No evidence of significant pulmonary embolus. Atelectasis in the lung bases.   Electronically Signed   By: Burman Nieves M.D.   On: 01/01/2014 21:57   Ct Abdomen Pelvis W Contrast  01/01/2014   CLINICAL DATA:  Nausea, abdominal pain and distention. Myocardial infarction and CABG on 12/18/2013  EXAM: CT ABDOMEN AND PELVIS WITH CONTRAST  TECHNIQUE: Multidetector CT imaging of the abdomen and pelvis was performed using the standard protocol following bolus administration of intravenous contrast.  CONTRAST:  OMNIPAQUE IOHEXOL 300 MG/ML  SOLN  COMPARISON:  None.  FINDINGS: The patient has a large pyloric channel ulcer with extensive edema in the pylorus and surrounding the duodenal bulb with soft tissue stranding in the adjacent soft tissues around the pylorus and duodenal bulb and the second portion of the duodenum. The pancreas and biliary tree are normal. Liver, spleen, adrenal glands, and kidneys are normal except for a 11 mm cyst in the mid left kidney and there is a 5 mm low-density lesion in the posterior aspect of the mid right kidney.  There a few diverticula in the distal colon. Terminal ileum and appendix are normal. Moderate calcification and atheromatous irregularity of the distal abdominal aorta and common iliac arteries. The bladder is empty.  The minimal atelectasis at the left lung base.  IMPRESSION: Large pyloric channel ulcer with prominent mucosal edema with inflammation in the adjacent soft tissues. No evidence of perforation at this time.   Electronically Signed   By: Geanie Cooley M.D.   On: 01/01/2014 21:56   Dg Abd 2 Views  01/01/2014   CLINICAL DATA:  Abdominal pain and distension and nausea.  EXAM: ABDOMEN - 2 VIEW  COMPARISON:  None.  FINDINGS: There are no dilated loops of bowel. Areas scattered throughout  the large and small bowel. No osseous abnormality. No worrisome abdominal calcifications.  IMPRESSION: Benign appearing abdomen.   Electronically Signed   By: Geanie Cooley M.D.   On: 01/01/2014 18:40     EKG Interpretation   Date/Time:  Thursday January 01 2014 16:27:09 EDT Ventricular Rate:  97 PR Interval:  142 QRS Duration: 88 QT Interval:  358 QTC Calculation: 454 R Axis:   110 Text Interpretation:  Sinus rhythm with Premature atrial complexes Left  posterior fascicular block Possible Inferior infarct , age undetermined  Anterior infarct , age undetermined T wave abnormality, consider lateral  ischemia Abnormal ECG improved since previous  Confirmed by Silverio Lay  MD,  DAVID  ([id number]) on 01/01/2014 5:19:01 PM      MDM   Final diagnoses:  Acute pyloric channel ulcer    Patient is a 53 year old male with a significant past medical history of a CABG done approximately 14 days ago. Patient presents with nausea weakness inability to eat for the past 3-4 days. Patient has been constipated since his surgery. Patient has tried some stool softeners without relief. Patient used milk of magnesia 3 days ago and had a loose bowel movement. Since then has not had any stool. Patient denies flatus the past couple days. Denies prior abdominal surgery.  EKG improved from prior. Chest x-ray unremarkable. Troponin negative. Patient with nonsurgical abdomen.   Likely ileus secondary to previous surgery. Also may be secondary to narcotic use.  Patient with continued pain. Will CT to rule out ileus.  CT with concern for pyloric channel ulcer, spoke with GI, patient needs eventual scope.  Since we will admit for dehydration, they will evaluate in the morning.  Given protonix, lipase added on.  Admit hospitalist.   Melene Planan Claudina Oliphant, MD 01/01/14 2252

## 2014-01-01 NOTE — ED Provider Notes (Signed)
Medical screening examination/treatment/procedure(s) were conducted as a shared visit with non-physician practitioner(s) and myself.  I personally evaluated the patient during the encounter.   EKG Interpretation   Date/Time:  Thursday January 01 2014 16:27:09 EDT Ventricular Rate:  97 PR Interval:  142 QRS Duration: 88 QT Interval:  358 QTC Calculation: 454 R Axis:   110 Text Interpretation:  Sinus rhythm with Premature atrial complexes Left  posterior fascicular block Possible Inferior infarct , age undetermined  Anterior infarct , age undetermined T wave abnormality, consider lateral  ischemia Abnormal ECG improved since previous  Confirmed by YAO  MD, DAVID  ([id number]) on 01/01/2014 5:19:01 PM      Lee May is a 53 y.o. male hx of CABG 2 weeks ago here with nausea, constipation. Persistent nausea and constipation for several weeks since surgery. Tried milk of magnesia several days ago with minimal relief. Mild epigastric tenderness on exam. Lungs clear. However, patient's oxygen is 89-90% when he sleeps. Given recent surgery, CT angio ordered to r/o PE and showed no PE. CT ab/pel showed large pyloric channel ulcer. GI consulted. Patient still nauseated and is dehydrated. Will admit for observation.    Richardean Canal, MD 01/01/14 [id number]

## 2014-01-01 NOTE — ED Notes (Signed)
Pt provided w/ ginger ale - Dr. Onalee Hua advises pt may have x1 drink and then to be NPO after midnight - pt verbalizes understanding.

## 2014-01-01 NOTE — H&P (Signed)
Chief Complaint:  abd pain for 2 weeks  HPI: 53 yo Lee s/p recent stemi and 5 v cabg beginning of this month July 15 has had a routine post cabg recovery but has been having this epigastric abdominal pain that has progressively worsened over the last 2.5 weeks.  He thought this was because he was constipated.  After the surgery, he was not eating back to normal, and he has been on oxycodone which he normally took on an empty stomach.  No other nsaids other than the aspirin post op.  No other otc nsaids.  He has been under a lot of stress with his recent surgery/heart attach.  No melana or brbpr.  Mostly has nausea no vomiting.  Pain not worse or better with anything particular.  He has been vomiting occasionally which has been nonbloody.  Does not drink etoh.    Review of Systems:  Positive and negative as per HPI otherwise all other systems are negative  Past Medical History: Past Medical History  Diagnosis Date  . MI (myocardial infarction)   . CAD (coronary artery disease), native coronary artery   . ST elevation (STEMI) myocardial infarction involving left anterior descending coronary artery 12/18/2013    Cath: early mLAD 100% after mod D1, mRCA 100% (L-R collaterals), Om1 80% ostial --> PTCA of LAD --> CABG x 5 ; Echo: EF 35-40% - hypokinesis of inferior, inferoseptal and distal posterior walls; apical dyskinesis  . 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 Laneta SimmersBartle): LIMA-LAD, SVG-rAM, SeqSVG-RPDA-PL, SVG-OM  . Heavy smoker     3PPD  . Essential hypertension   . Dyslipidemia, goal LDL below 70   . CHF (congestive heart failure)    Past Surgical History  Procedure Laterality Date  . Coronary angioplasty with stent placement    . Fracture surgery    . Coronary artery bypass graft N/A 12/18/2013    Procedure: CORONARY ARTERY BYPASS GRAFTING (CABG);  Surgeon: Alleen BorneBryan K Bartle, MD;  [facility];  Service: Open Heart Surgery;  Laterality: N/A;  . Intraoperative transesophageal echocardiogram N/A  12/18/2013    Procedure: INTRAOPERATIVE TRANSESOPHAGEAL ECHOCARDIOGRAM;  Surgeon: Alleen BorneBryan K Bartle, MD;  [facility];  Service: Open Heart Surgery;  Laterality: N/A;    Medications: Prior to Admission medications   Medication Sig Start Date End Date Taking? Authorizing Provider  aspirin EC 325 MG EC tablet Take 1 tablet (325 mg total) by mouth daily. 12/23/13  Yes Donielle Margaretann LovelessM Zimmerman, PA-C  atorvastatin (LIPITOR) 40 MG tablet Take 1 tablet (40 mg total) by mouth daily at 6 PM. 12/23/13  Yes Donielle Margaretann LovelessM Zimmerman, PA-C  docusate sodium (COLACE) 100 MG capsule Take 100 mg by mouth daily.   Yes Historical Provider, MD  magnesium hydroxide (MILK OF MAGNESIA) 400 MG/5ML suspension Take 30 mLs by mouth daily as needed for moderate constipation.   Yes Historical Provider, MD  metoprolol tartrate (LOPRESSOR) 25 MG tablet Take 1 tablet (25 mg total) by mouth 2 (two) times daily. 12/23/13  Yes Donielle Margaretann LovelessM Zimmerman, PA-C  Misc Natural Products (LAXATIVE FORMULA) TABS Take 1 tablet by mouth daily as needed (for constipation).   Yes Historical Provider, MD  oxyCODONE (OXY IR/ROXICODONE) 5 MG immediate release tablet Take 1-2 tablets (5-10 mg total) by mouth every 4 (four) hours as needed for severe pain. 12/29/13  Yes Purcell Nailslarence H Owen, MD    Allergies:  No Known Allergies  Social History:  reports that he has been smoking.  He does not  have any smokeless tobacco history on file. He reports that he does not drink alcohol or use illicit drugs.  Family History: Family History  Problem Relation Age of Onset  . Cancer Other   . CAD Other   . Cancer Father   . Hypertension Father   . Stroke Father   . Heart disease Mother     Physical Exam: Filed Vitals:   01/01/14 1930 01/01/14 2001 01/01/14 2136 01/01/14 2236  BP: 133/81 126/72  124/65  Pulse: 87 84 76 82  Temp: 98.2 F (36.8 C)     TempSrc: Oral     Resp: 26 24 22 22   SpO2: 99% 94% 96% 93%   General appearance: alert, cooperative and no  distress Head: Normocephalic, without obvious abnormality, atraumatic Eyes: negative Nose: Nares normal. Septum midline. Mucosa normal. No drainage or sinus tenderness. Neck: no JVD and supple, symmetrical, trachea midline Lungs: clear to auscultation bilaterally Heart: regular rate and rhythm, S1, S2 normal, no murmur, click, rub or gallop Abdomen: soft, non-tender; bowel sounds normal; no masses,  no organomegaly Extremities: extremities normal, atraumatic, no cyanosis or edema Pulses: 2+ and symmetric Skin: Skin color, texture, turgor normal. No rashes or lesions Neurologic: Grossly normal    Labs on Admission:   Recent Labs  01/01/14 1705  NA 139  K 4.0  CL 99  CO2 23  GLUCOSE 88  BUN 12  CREATININE 0.91  CALCIUM 9.2    Recent Labs  01/01/14 1705  AST 17  ALT 38  ALKPHOS 115  BILITOT 0.3  PROT 7.6  ALBUMIN 3.0*    Recent Labs  01/01/14 1705  WBC 12.1*  NEUTROABS 9.6*  HGB 13.1  HCT 38.4*  MCV 88.9  PLT 609*   Radiological Exams on Admission: Dg Chest 2 View  01/01/2014   CLINICAL DATA:  Nausea, abdominal pain, and distention. Recent myocardial infarction and CABG.  EXAM: CHEST  2 VIEW  COMPARISON:  12/20/2013 and 09/08/2012  FINDINGS: Heart size and pulmonary vascularity are normal. Minimal residual atelectasis at the left lung base. Small effusion has completely resolved. Right lung is clear. No osseous abnormality.  IMPRESSION: Minimal residual atelectasis at the left lung base.   Electronically Signed   By: Geanie Cooley M.D.   On: 01/01/2014 18:42   Ct Angio Chest Pe W/cm &/or Wo Cm  01/01/2014   CLINICAL DATA:  MI and CABG on 12/18/2013. Today began having nausea, abdominal pain, and distention. Hypoxia.  EXAM: CT ANGIOGRAPHY CHEST WITH CONTRAST  TECHNIQUE: Multidetector CT imaging of the chest was performed using the standard protocol during bolus administration of intravenous contrast. Multiplanar CT image reconstructions and MIPs were obtained to  evaluate the vascular anatomy.  CONTRAST:  100 mL Omnipaque 300  COMPARISON:  Chest 01/01/2014  FINDINGS: Technically adequate study with good opacification of the central and segmental pulmonary arteries. No focal filling defects are demonstrated. No evidence of significant pulmonary embolus.  Normal heart size. Normal caliber thoracic aorta. No evidence of dissection. Postoperative changes in the mediastinum consistent with Coronary bypass graft. Esophagus is decompressed. No significant lymphadenopathy in the chest.  Mild dependent atelectasis in the lung bases. No focal airspace disease or consolidation. No pleural effusion. No pneumothorax. No destructive bone lesions.  Review of the MIP images confirms the above findings.  IMPRESSION: No evidence of significant pulmonary embolus. Atelectasis in the lung bases.   Electronically Signed   By: Burman Nieves M.D.   On: 01/01/2014 21:57   Ct  Abdomen Pelvis W Contrast  01/01/2014   CLINICAL DATA:  Nausea, abdominal pain and distention. Myocardial infarction and CABG on 12/18/2013  EXAM: CT ABDOMEN AND PELVIS WITH CONTRAST  TECHNIQUE: Multidetector CT imaging of the abdomen and pelvis was performed using the standard protocol following bolus administration of intravenous contrast.  CONTRAST:  OMNIPAQUE IOHEXOL 300 MG/ML  SOLN  COMPARISON:  None.  FINDINGS: The patient has a large pyloric channel ulcer with extensive edema in the pylorus and surrounding the duodenal bulb with soft tissue stranding in the adjacent soft tissues around the pylorus and duodenal bulb and the second portion of the duodenum. The pancreas and biliary tree are normal. Liver, spleen, adrenal glands, and kidneys are normal except for a 11 mm cyst in the mid left kidney and there is a 5 mm low-density lesion in the posterior aspect of the mid right kidney.  There a few diverticula in the distal colon. Terminal ileum and appendix are normal. Moderate calcification and atheromatous  irregularity of the distal abdominal aorta and common iliac arteries. The bladder is empty.  The minimal atelectasis at the left lung base.  IMPRESSION: Large pyloric channel ulcer with prominent mucosal edema with inflammation in the adjacent soft tissues. No evidence of perforation at this time.   Electronically Signed   By: Geanie Cooley M.D.   On: 01/01/2014 21:56   Dg Abd 2 Views  01/01/2014   CLINICAL DATA:  Abdominal pain and distension and nausea.  EXAM: ABDOMEN - 2 VIEW  COMPARISON:  None.  FINDINGS: There are no dilated loops of bowel. Areas scattered throughout the large and small bowel. No osseous abnormality. No worrisome abdominal calcifications.  IMPRESSION: Benign appearing abdomen.   Electronically Signed   By: Geanie Cooley M.D.   On: 01/01/2014 18:40    Assessment/Plan  53 yo Lee with recent open heart surgery after STEMI comes in with epigastric pain found to have pyloric channel ulcer per ct scan w/o perforation  Principal Problem:   Acute pyloric channel ulcer-  Place on iv protonix q12.  Gi has been called, and will likely do egd in am.  Keep npo after midnight, will hold anticoagulation, but will not hold cardiac meds including aspirin for now.  Active Problems:  Stable unless o/w noted   Acute ST elevation myocardial infarction (STEMI) involving left anterior descending coronary artery 7/15   S/P CABG x 5 on 12/19/13   Heavy smoker   Essential hypertension   Abdominal pain, acute, epigastric   Nausea alone    Lee May A 01/01/2014, 10:47 PM

## 2014-01-02 DIAGNOSIS — I2109 ST elevation (STEMI) myocardial infarction involving other coronary artery of anterior wall: Secondary | ICD-10-CM

## 2014-01-02 DIAGNOSIS — K253 Acute gastric ulcer without hemorrhage or perforation: Principal | ICD-10-CM

## 2014-01-02 DIAGNOSIS — R110 Nausea: Secondary | ICD-10-CM | POA: Diagnosis present

## 2014-01-02 MED ORDER — ASPIRIN EC 325 MG PO TBEC
325.0000 mg | DELAYED_RELEASE_TABLET | Freq: Every day | ORAL | Status: DC
  Administered 2014-01-02 – 2014-01-03 (×2): 325 mg via ORAL
  Filled 2014-01-02 (×2): qty 1

## 2014-01-02 MED ORDER — PANTOPRAZOLE SODIUM 40 MG PO TBEC
40.0000 mg | DELAYED_RELEASE_TABLET | Freq: Two times a day (BID) | ORAL | Status: DC
  Administered 2014-01-03: 40 mg via ORAL
  Filled 2014-01-02: qty 1

## 2014-01-02 MED ORDER — ATORVASTATIN CALCIUM 40 MG PO TABS
40.0000 mg | ORAL_TABLET | Freq: Every day | ORAL | Status: DC
  Administered 2014-01-02: 40 mg via ORAL
  Filled 2014-01-02 (×2): qty 1

## 2014-01-02 MED ORDER — METOPROLOL TARTRATE 25 MG PO TABS
25.0000 mg | ORAL_TABLET | Freq: Two times a day (BID) | ORAL | Status: DC
  Administered 2014-01-02 – 2014-01-03 (×4): 25 mg via ORAL
  Filled 2014-01-02 (×5): qty 1

## 2014-01-02 NOTE — Progress Notes (Signed)
Patient called RN and asked if he was being discharged today. The plan for increasing his diet slowly and blood work  In the am was explained. He said is stomach was fine now, the liquid tray was successful, and he wanted real food. Diet was progressed. Will continue to monitor.

## 2014-01-02 NOTE — Progress Notes (Signed)
[facility] PROGRESS NOTE   BEREKET FORSBERG RFF:[id number] DOB: 06-28-1960 DOA: 01/01/2014 PCP: No PCP Per Patient  HPI/Subjective: Feels okay, denies any abdominal pain, denies any nausea vomiting.  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 CT scan of abdomen/pelvis showed acute pyloric channel ulcer. Place on iv protonix q12. Gi has been consulted. Keep npo after midnight, will hold anticoagulation, but will not hold cardiac meds including aspirin for now.   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   Heavy smoker  Counseled extensively about smoking.  Essential hypertension  Blood pressure is controlled, stable.  Abdominal pain, acute, epigastric  Likely secondary to the ulcer, this is improved after patient started on the PPIs.   Code Status: Full code Family Communication: Plan discussed with the patient. Disposition Plan: Remains inpatient   Consultants:  Gastroenterology  Procedures:  None  Antibiotics:  None   Objective: Filed Vitals:   01/02/14 0547  BP: 133/82  Pulse: 63  Temp: 97.5 F (36.4 C)  Resp: 20    Intake/Output Summary (Last 24 hours) at 01/02/14 1150 Last data filed at 01/02/14 0800  Gross per 24 hour  Intake 1366.25 ml  Output    200 ml  Net 1166.25 ml   There were no vitals filed for this visit.  Exam: General: Alert and awake, oriented x3, not in any acute distress. HEENT: anicteric sclera, pupils reactive to light and accommodation, EOMI CVS: S1-S2 clear, no murmur rubs  or gallops Chest: clear to auscultation bilaterally, no wheezing, rales or rhonchi Abdomen: soft nontender, nondistended, normal bowel sounds, no organomegaly Extremities: no cyanosis, clubbing or edema noted bilaterally Neuro: Cranial nerves II-XII intact, no focal neurological deficits  Data Reviewed: Basic Metabolic Panel:  Recent Labs Lab 01/01/14 1705  NA 139  K 4.0  CL 99  CO2 23  GLUCOSE 88  BUN 12  CREATININE 0.91  CALCIUM 9.2   Liver Function Tests:  Recent Labs Lab 01/01/14 1705  AST 17  ALT 38  ALKPHOS 115  BILITOT 0.3  PROT 7.6  ALBUMIN 3.0*    Recent Labs Lab 01/01/14 2320  LIPASE 34   No results found for this basename: AMMONIA,  in the last 168 hours CBC:  Recent Labs Lab 01/01/14 1705  WBC 12.1*  NEUTROABS 9.6*  HGB 13.1  HCT 38.4*  MCV 88.9  PLT 609*   Cardiac Enzymes: No results found for this basename: CKTOTAL, CKMB, CKMBINDEX, TROPONINI,  in the last 168 hours BNP (last 3 results) No results found for this basename: PROBNP,  in the last 8760 hours CBG: No results found for this basename: GLUCAP,  in the last 168 hours  Micro No results found for this or any previous visit (from the past 240 hour(s)).   Studies: Dg Chest 2 View  01/01/2014   CLINICAL DATA:  Nausea, abdominal pain, and distention. Recent myocardial infarction and CABG.  EXAM: CHEST  2 VIEW  COMPARISON:  12/20/2013 and 09/08/2012  FINDINGS: Heart size and pulmonary vascularity are normal. Minimal residual atelectasis at the left lung base. Small  effusion has completely resolved. Right lung is clear. No osseous abnormality.  IMPRESSION: Minimal residual atelectasis at the left lung base.   Electronically Signed   By: Geanie Cooley M.D.   On: 01/01/2014 18:42   Ct Angio Chest Pe W/cm &/or Wo Cm  01/01/2014   CLINICAL DATA:  MI and CABG on 12/18/2013. Today began having nausea, abdominal pain, and distention. Hypoxia.  EXAM: CT ANGIOGRAPHY CHEST WITH CONTRAST  TECHNIQUE:  Multidetector CT imaging of the chest was performed using the standard protocol during bolus administration of intravenous contrast. Multiplanar CT image reconstructions and MIPs were obtained to evaluate the vascular anatomy.  CONTRAST:  100 mL Omnipaque 300  COMPARISON:  Chest 01/01/2014  FINDINGS: Technically adequate study with good opacification of the central and segmental pulmonary arteries. No focal filling defects are demonstrated. No evidence of significant pulmonary embolus.  Normal heart size. Normal caliber thoracic aorta. No evidence of dissection. Postoperative changes in the mediastinum consistent with Coronary bypass graft. Esophagus is decompressed. No significant lymphadenopathy in the chest.  Mild dependent atelectasis in the lung bases. No focal airspace disease or consolidation. No pleural effusion. No pneumothorax. No destructive bone lesions.  Review of the MIP images confirms the above findings.  IMPRESSION: No evidence of significant pulmonary embolus. Atelectasis in the lung bases.   Electronically Signed   By: Burman Nieves M.D.   On: 01/01/2014 21:57   Ct Abdomen Pelvis W Contrast  01/01/2014   CLINICAL DATA:  Nausea, abdominal pain and distention. Myocardial infarction and CABG on 12/18/2013  EXAM: CT ABDOMEN AND PELVIS WITH CONTRAST  TECHNIQUE: Multidetector CT imaging of the abdomen and pelvis was performed using the standard protocol following bolus administration of intravenous contrast.  CONTRAST:  OMNIPAQUE IOHEXOL 300 MG/ML  SOLN  COMPARISON:  None.  FINDINGS: The patient has a large pyloric channel ulcer with extensive edema in the pylorus and surrounding the duodenal bulb with soft tissue stranding in the adjacent soft tissues around the pylorus and duodenal bulb and the second portion of the duodenum. The pancreas and biliary tree are normal. Liver, spleen, adrenal glands, and kidneys are normal except for a 11 mm cyst in the mid left kidney and there is a 5 mm  low-density lesion in the posterior aspect of the mid right kidney.  There a few diverticula in the distal colon. Terminal ileum and appendix are normal. Moderate calcification and atheromatous irregularity of the distal abdominal aorta and common iliac arteries. The bladder is empty.  The minimal atelectasis at the left lung base.  IMPRESSION: Large pyloric channel ulcer with prominent mucosal edema with inflammation in the adjacent soft tissues. No evidence of perforation at this time.   Electronically Signed   By: Geanie Cooley M.D.   On: 01/01/2014 21:56   Dg Abd 2 Views  01/01/2014   CLINICAL DATA:  Abdominal pain and distension and nausea.  EXAM: ABDOMEN - 2 VIEW  COMPARISON:  None.  FINDINGS: There are no dilated loops of bowel. Areas scattered throughout the large and small bowel. No osseous abnormality. No worrisome abdominal calcifications.  IMPRESSION: Benign appearing abdomen.   Electronically Signed   By: Geanie Cooley M.D.   On: 01/01/2014 18:40    Scheduled Meds: . aspirin EC  325 mg Oral Daily  . atorvastatin  40 mg Oral q1800  . metoprolol tartrate  25 mg Oral BID  . pantoprazole (PROTONIX) IV  40 mg Intravenous Q12H   Continuous Infusions:  Time spent: 35 minutes    [facility] LLCELMAHI,Caramia Boutin A  [facility] Pager [phone] If 7PM-7AM, please contact night-coverage at [email or web address], password [facility] 01/02/2014, 11:50 AM  LOS: 1 day

## 2014-01-02 NOTE — Progress Notes (Signed)
Utilization Review Completed.Lee May T7/31/2015  
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
## 2014-01-03 DIAGNOSIS — F17200 Nicotine dependence, unspecified, uncomplicated: Secondary | ICD-10-CM

## 2014-01-03 LAB — BASIC METABOLIC PANEL
ANION GAP: 14 (ref 5–15)
BUN: 17 mg/dL (ref 6–23)
CALCIUM: 8.7 mg/dL (ref 8.4–10.5)
CO2: 26 meq/L (ref 19–32)
Chloride: 98 mEq/L (ref 96–112)
Creatinine, Ser: 0.83 mg/dL (ref 0.50–1.35)
Glucose, Bld: 91 mg/dL (ref 70–99)
Potassium: 4 mEq/L (ref 3.7–5.3)
SODIUM: 138 meq/L (ref 137–147)

## 2014-01-03 LAB — CBC
HEMATOCRIT: 39.3 % (ref 39.0–52.0)
Hemoglobin: 13 g/dL (ref 13.0–17.0)
MCH: 29.2 pg (ref 26.0–34.0)
MCHC: 33.1 g/dL (ref 30.0–36.0)
MCV: 88.3 fL (ref 78.0–100.0)
PLATELETS: 631 10*3/uL — AB (ref 150–400)
RBC: 4.45 MIL/uL (ref 4.22–5.81)
RDW: 13.5 % (ref 11.5–15.5)
WBC: 11.3 10*3/uL — AB (ref 4.0–10.5)

## 2014-01-03 MED ORDER — PANTOPRAZOLE SODIUM 40 MG PO TBEC: 40.0000 mg | DELAYED_RELEASE_TABLET | Freq: Every day | ORAL | Status: DC

## 2014-01-03 NOTE — Discharge Summary (Signed)
Physician Discharge Summary  Lee May:[id number] DOB: 1960-11-21 DOA: 01/01/2014  PCP: No PCP Per Patient  Admit date: 01/01/2014 Discharge date: 01/03/2014  Time spent: 40 minutes  Recommendations for Outpatient Follow-up:  1. Followup with Dr. 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  History of present illness:  53 yo male s/p recent stemi and 5 v cabg beginning of this month July 15 has had a routine post cabg recovery but has been having this epigastric abdominal pain that has progressively worsened over the last 2.5 weeks. He thought this was because he was constipated. After the surgery, he was not eating back to normal, and he has been on oxycodone which he normally took on an empty stomach. No other nsaids other than the aspirin post op. No other otc nsaids. He has been under a lot of stress with his recent surgery/heart attach. No melana or brbpr. Mostly has nausea no vomiting. Pain not worse or better with anything particular. He has been vomiting occasionally which has been nonbloody. Does not drink etoh  Hospital Course:   Acute pyloric ulcer  Presented with abdominal pain and nausea.  CT scan of abdomen/pelvis showed acute pyloric channel ulcer.  Place on iv protonix q12. Gi has been consulted.  After evaluated by GI recommended to restart diet. Also recommended to continue aspirin. Patient pain resolved, tolerated diet very well, recommended daily PPI and followup as outpatient with Dr. Leone Payor.  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   Heavy smoker  Counseled extensively about smoking.   Essential hypertension  Blood pressure is controlled, stable.   Abdominal pain, acute, epigastric  Likely secondary to the ulcer, this is improved after patient started on the PPIs.   Procedures:  None.  Consultations:  Gastroenterology  Discharge Exam: Filed Vitals:   01/03/14 0557  BP: 112/55  Pulse: 76  Temp: 97.7 F (36.5 C)  Resp: 18   General: Alert and awake, oriented x3, not in any acute distress. HEENT: anicteric sclera, pupils reactive to light and accommodation, EOMI CVS: S1-S2 clear, no murmur rubs or gallops Chest: clear to auscultation bilaterally, no wheezing, rales or rhonchi Abdomen: soft nontender, nondistended, normal bowel sounds, no organomegaly Extremities: no cyanosis, clubbing or edema noted bilaterally Neuro: Cranial nerves II-XII intact, no focal neurological deficits  Discharge Instructions You were cared for by a hospitalist during your hospital stay. If you have any questions about your discharge medications or the care you received while you were in the hospital after you are discharged, you can call the unit and asked to speak with the hospitalist on call if the hospitalist that took care of you is not available. Once you are discharged, your primary care physician will handle any further medical issues. Please note that NO REFILLS for any discharge medications will be authorized once you are discharged, as it is imperative that you return to your primary care physician (or establish a relationship with a primary  care physician if you do not have one) for your aftercare needs so that they can reassess your need for medications and monitor your lab values.  Discharge Instructions   Diet - low sodium heart healthy    Complete by:  As directed      Increase activity slowly     Complete by:  As directed             Medication List         aspirin 325 MG EC tablet  Take 1 tablet (325 mg total) by mouth daily.     atorvastatin 40 MG tablet  Commonly known as:  LIPITOR  Take 1 tablet (40 mg total) by mouth daily at 6 PM.     docusate sodium 100 MG capsule  Commonly known as:  COLACE  Take 100 mg by mouth daily.     LAXATIVE FORMULA Tabs  Take 1 tablet by mouth daily as needed (for constipation).     magnesium hydroxide 400 MG/5ML suspension  Commonly known as:  MILK OF MAGNESIA  Take 30 mLs by mouth daily as needed for moderate constipation.     metoprolol tartrate 25 MG tablet  Commonly known as:  LOPRESSOR  Take 1 tablet (25 mg total) by mouth 2 (two) times daily.     oxyCODONE 5 MG immediate release tablet  Commonly known as:  Oxy IR/ROXICODONE  Take 1-2 tablets (5-10 mg total) by mouth every 4 (four) hours as needed for severe pain.     pantoprazole 40 MG tablet  Commonly known as:  PROTONIX  Take 1 tablet (40 mg total) by mouth daily.       No Known Allergies     Follow-up Information   Follow up with Stan Head, MD. (Office will contact patient to arrange appointment)    Specialty:  Gastroenterology   Contact information:   [address] [place] [address] [phone]        The results of significant diagnostics from this hospitalization (including imaging, microbiology, ancillary and laboratory) are listed below for reference.    Significant Diagnostic Studies: Dg Chest 2 View  01/01/2014   CLINICAL DATA:  Nausea, abdominal pain, and distention. Recent myocardial infarction and CABG.  EXAM: CHEST  2 VIEW  COMPARISON:  12/20/2013 and 09/08/2012  FINDINGS: Heart size and pulmonary vascularity are normal. Minimal residual atelectasis at the left lung base. Small effusion has completely resolved. Right lung is clear. No osseous abnormality.  IMPRESSION: Minimal residual atelectasis at the left lung base.   Electronically  Signed   By: Geanie Cooley M.D.   On: 01/01/2014 18:42   Ct Angio Chest Pe W/cm &/or Wo Cm  01/01/2014   CLINICAL DATA:  MI and CABG on 12/18/2013. Today began having nausea, abdominal pain, and distention. Hypoxia.  EXAM: CT ANGIOGRAPHY CHEST WITH CONTRAST  TECHNIQUE: Multidetector CT imaging of the chest was performed using the standard protocol during bolus administration of intravenous contrast. Multiplanar CT image reconstructions and MIPs were obtained to evaluate the vascular anatomy.  CONTRAST:  100 mL Omnipaque 300  COMPARISON:  Chest 01/01/2014  FINDINGS: Technically adequate study with good opacification of the central and segmental pulmonary arteries. No focal filling defects are demonstrated. No evidence of significant pulmonary embolus.  Normal heart size. Normal caliber thoracic aorta. No evidence of dissection. Postoperative changes in the mediastinum consistent with Coronary bypass graft. Esophagus is decompressed. No significant lymphadenopathy in the chest.  Mild dependent atelectasis in the  lung bases. No focal airspace disease or consolidation. No pleural effusion. No pneumothorax. No destructive bone lesions.  Review of the MIP images confirms the above findings.  IMPRESSION: No evidence of significant pulmonary embolus. Atelectasis in the lung bases.   Electronically Signed   By: Burman Nieves M.D.   On: 01/01/2014 21:57   Ct Abdomen Pelvis W Contrast  01/01/2014   CLINICAL DATA:  Nausea, abdominal pain and distention. Myocardial infarction and CABG on 12/18/2013  EXAM: CT ABDOMEN AND PELVIS WITH CONTRAST  TECHNIQUE: Multidetector CT imaging of the abdomen and pelvis was performed using the standard protocol following bolus administration of intravenous contrast.  CONTRAST:  OMNIPAQUE IOHEXOL 300 MG/ML  SOLN  COMPARISON:  None.  FINDINGS: The patient has a large pyloric channel ulcer with extensive edema in the pylorus and surrounding the duodenal bulb with soft tissue stranding  in the adjacent soft tissues around the pylorus and duodenal bulb and the second portion of the duodenum. The pancreas and biliary tree are normal. Liver, spleen, adrenal glands, and kidneys are normal except for a 11 mm cyst in the mid left kidney and there is a 5 mm low-density lesion in the posterior aspect of the mid right kidney.  There a few diverticula in the distal colon. Terminal ileum and appendix are normal. Moderate calcification and atheromatous irregularity of the distal abdominal aorta and common iliac arteries. The bladder is empty.  The minimal atelectasis at the left lung base.  IMPRESSION: Large pyloric channel ulcer with prominent mucosal edema with inflammation in the adjacent soft tissues. No evidence of perforation at this time.   Electronically Signed   By: Geanie Cooley M.D.   On: 01/01/2014 21:56   Dg Chest Port 1 View  12/20/2013   CLINICAL DATA:  Postop cardiac surgery.  EXAM: PORTABLE CHEST - 1 VIEW  COMPARISON:  12/19/2013  FINDINGS: With the exception of the right internal jugular introducer sheath, all lines and tubes have been removed. No pneumothorax or edema. No mediastinal widening. Mild basilar atelectasis, similar to the prior study.  IMPRESSION: No acute findings.  Mild persistent lung base atelectasis.   Electronically Signed   By: Amie Portland M.D.   On: 12/20/2013 08:00   Dg Chest Portable 1 View In Am  12/19/2013   CLINICAL DATA:  Postop for CABG.  EXAM: PORTABLE CHEST - 1 VIEW  COMPARISON:  One day prior  FINDINGS: Extubation removal of nasogastric tube. Right IJ Swan-Ganz catheter with tip at right pulmonary artery. Mediastinal drain. Two left chest tubes. Prior median sternotomy.  Cardiomegaly accentuated by AP portable technique. No pleural effusion or pneumothorax. Low lung volumes with resultant pulmonary interstitial prominence. No congestive failure. Minimal bibasilar atelectasis.  IMPRESSION: Expected appearance after extubation, with diminished lung volumes  and development of bibasilar atelectasis.  Cardiomegaly without congestive failure.  Other support apparatus stable in position, without pneumothorax.   Electronically Signed   By: Jeronimo Greaves M.D.   On: 12/19/2013 08:24   Dg Chest Portable 1 View  12/18/2013   CLINICAL DATA:  Postop CABG.  EXAM: PORTABLE CHEST - 1 VIEW  COMPARISON:  12/18/2013 and 09/08/2012  FINDINGS: Sternotomy wires are intact. Endotracheal tube has tip 4.4 cm above the carina. Enteric tube courses into the stomach where is coiled once with tip over the gastric fundus in the left upper quadrant. Mediastinal drain is present. Left-sided chest tube appears in adequate position. Right IJ Swan-Ganz catheter has tip over the right main pulmonary  artery.  Lungs are hypoinflated without focal consolidation, effusion or pneumothorax. Subtle stable prominence of the perihilar markings. Cardiomediastinal silhouette and remainder of the exam is unremarkable.  IMPRESSION: Hypoinflation without acute cardiopulmonary disease.  Tubes and lines as described.   Electronically Signed   By: Elberta Fortisaniel  Boyle M.D.   On: 12/18/2013 18:58   Dg Chest Port 1 View  12/18/2013   CLINICAL DATA:  Acute onset chest pain.  EXAM: PORTABLE CHEST - 1 VIEW  COMPARISON:  Chest radiograph September 08, 2012  FINDINGS: Cardiomediastinal silhouette is unremarkable for this low inspiratory portable examination with crowded vasculature markings. Central pulmonary vascular congestion. Similar mild chronic interstitial changes. The lungs are clear without pleural effusions or focal consolidations. Trachea projects midline and there is no pneumothorax. Included soft tissue planes and osseous structures are non-suspicious.  IMPRESSION: Central pulmonary vascular congestion superimposed on mild chronic interstitial changes.   Electronically Signed   By: Awilda Metroourtnay  Bloomer   On: 12/18/2013 04:33   Dg Abd 2 Views  01/01/2014   CLINICAL DATA:  Abdominal pain and distension and nausea.  EXAM:  ABDOMEN - 2 VIEW  COMPARISON:  None.  FINDINGS: There are no dilated loops of bowel. Areas scattered throughout the large and small bowel. No osseous abnormality. No worrisome abdominal calcifications.  IMPRESSION: Benign appearing abdomen.   Electronically Signed   By: Geanie CooleyJim  Maxwell M.D.   On: 01/01/2014 18:40    Microbiology: No results found for this or any previous visit (from the past 240 hour(s)).   Labs: Basic Metabolic Panel:  Recent Labs Lab 01/01/14 1705 01/03/14 0420  NA 139 138  K 4.0 4.0  CL 99 98  CO2 23 26  GLUCOSE 88 91  BUN 12 17  CREATININE 0.91 0.83  CALCIUM 9.2 8.7   Liver Function Tests:  Recent Labs Lab 01/01/14 1705  AST 17  ALT 38  ALKPHOS 115  BILITOT 0.3  PROT 7.6  ALBUMIN 3.0*    Recent Labs Lab 01/01/14 2320  LIPASE 34   No results found for this basename: AMMONIA,  in the last 168 hours CBC:  Recent Labs Lab 01/01/14 1705 01/03/14 0420  WBC 12.1* 11.3*  NEUTROABS 9.6*  --   HGB 13.1 13.0  HCT 38.4* 39.3  MCV 88.9 88.3  PLT 609* 631*   Cardiac Enzymes: No results found for this basename: CKTOTAL, CKMB, CKMBINDEX, TROPONINI,  in the last 168 hours BNP: BNP (last 3 results) No results found for this basename: PROBNP,  in the last 8760 hours CBG: No results found for this basename: GLUCAP,  in the last 168 hours     Signed:  Brelan Hannen A  [facility] 01/03/2014, 9:46 AM

## 2014-01-03 NOTE — Progress Notes (Signed)
   He feels ok - no pain. Tolerating diet.  DC today and I will arrange f/u with me  Iva Boop, MD, [facility] [phone] (pager) 01/03/2014 7:29 AM

## 2014-01-06 LAB — HELICOBACTER PYLORI ABS-IGG+IGA, BLD
H PYLORI IGA: 12.5 U/mL — AB (ref <9.0)
H PYLORI IGG: 0.62 {ISR}

## 2014-01-08 NOTE — Progress Notes (Signed)
Quick Note:  Needs treatment for H. Pylori Please call and explain we found evidence of infection linked to ulcer Need to treat w/ Abx at some point but does not look like he has Rx coverage so we can wait to treat but find out if he got a PPI     ______

## 2014-01-09 ENCOUNTER — Other Ambulatory Visit: Payer: Self-pay

## 2014-01-09 ENCOUNTER — Telehealth: Payer: Self-pay | Admitting: Internal Medicine

## 2014-01-09 NOTE — Progress Notes (Signed)
Quick Note:  Try amoxicillin 1000 mg bid and clarithromycin 500 mg bid x 10 days along with the pantoprazole he should be on ______

## 2014-01-09 NOTE — Telephone Encounter (Signed)
Advised the patient there was an interaction between some of the medications. I will call him once the prescriptions are sent to the pharmacy.

## 2014-01-09 NOTE — Progress Notes (Signed)
Quick Note:  Hold atorvastatin while on Biaxin (clarithromycin) ______

## 2014-01-09 NOTE — Telephone Encounter (Signed)
Please advise Sir, thank you. 

## 2014-01-09 NOTE — Telephone Encounter (Signed)
Lee May is working on that

## 2014-01-12 ENCOUNTER — Other Ambulatory Visit: Payer: Self-pay

## 2014-01-12 MED ORDER — CLARITHROMYCIN 500 MG PO TABS: 500.0000 mg | ORAL_TABLET | Freq: Two times a day (BID) | ORAL | Status: DC

## 2014-01-12 MED ORDER — AMOXICILLIN 500 MG PO CAPS: 1000.0000 mg | ORAL_CAPSULE | Freq: Two times a day (BID) | ORAL | Status: DC

## 2014-01-15 ENCOUNTER — Encounter: Payer: Self-pay | Admitting: Physician Assistant

## 2014-01-15 ENCOUNTER — Ambulatory Visit (INDEPENDENT_AMBULATORY_CARE_PROVIDER_SITE_OTHER): Payer: Self-pay | Admitting: Physician Assistant

## 2014-01-15 VITALS — BP 110/75 | HR 95 | Ht 72.0 in | Wt 220.0 lb

## 2014-01-15 DIAGNOSIS — K279 Peptic ulcer, site unspecified, unspecified as acute or chronic, without hemorrhage or perforation: Secondary | ICD-10-CM | POA: Insufficient documentation

## 2014-01-15 DIAGNOSIS — I2510 Atherosclerotic heart disease of native coronary artery without angina pectoris: Secondary | ICD-10-CM

## 2014-01-15 DIAGNOSIS — E785 Hyperlipidemia, unspecified: Secondary | ICD-10-CM

## 2014-01-15 DIAGNOSIS — I10 Essential (primary) hypertension: Secondary | ICD-10-CM

## 2014-01-15 DIAGNOSIS — I2589 Other forms of chronic ischemic heart disease: Secondary | ICD-10-CM

## 2014-01-15 MED ORDER — METOPROLOL TARTRATE 25 MG PO TABS: 37.5000 mg | ORAL_TABLET | Freq: Two times a day (BID) | ORAL | Status: DC

## 2014-01-15 NOTE — Progress Notes (Signed)
Cardiology Office Note    Date:  01/15/2014   ID:  Lee May, DOB Apr 18, 1961, MRN [id number]  PCP:  No PCP Per Patient  Cardiologist:  Dr. Armanda Magic      History of Present Illness: Lee May is a 53 y.o. male with a hx of CAD s/p remote MI with PCI in [place] 2009, HTN, HL.  He was admitted last month with an ant-lat STEMI.  Emergent cardiac catheterization demonstrated 3 vessel CAD with acute occlusion of the LAD. LAD was treated with balloon angioplasty. Echocardiogram demonstrated an EF of 35-40%. He was seen by cardiac surgery and recommendation was to proceed with CABG. He underwent CABG with Dr. Laneta Simmers on 12/19/13 (LIMA-LAD, SVG-OM1, SVG-AM, SVG-PDA/PL). Postoperative course was fairly uneventful. He remained in sinus rhythm. He was readmitted 7/30-8/1 with abdominal pain and nausea. CT scan demonstrated acute pyloric channel ulcer. He was treated with IV Protonix.  Follow up testing as an outpatient demonstrated H. Pylori. He was placed on treatment. He returns for follow up.  He is doing well. Stomach discomfort is improved.The patient denies any chest pain, significant dyspnea, syncope, orthopnea, PND, edema. Incision is somewhat numb. He denies significant soreness.  Studies:  - LHC (7/15):  LAD occluded, RCA occluded, OM1 70-80% >>> POBA to mid LAD, EF 45% inf-apical HK  - Echo (7/15):  EF 35-40%, inf, inf-septal and dist post HK, mild LVH, Gr 1 DD.   Recent Labs/Images: 12/18/2013: TSH 0.431  01/01/2014: ALT 38  01/03/2014: Creatinine 0.83; Hemoglobin 13.0; Potassium 4.0    Wt Readings from Last 3 Encounters:  01/15/14 220 lb (99.791 kg)  12/25/13 227 lb (102.967 kg)  12/23/13 222 lb 7.1 oz (100.9 kg)     Past Medical History  Diagnosis Date  . MI (myocardial infarction)   . CAD (coronary artery disease), native coronary artery   . ST elevation (STEMI) myocardial infarction involving left anterior descending coronary artery 12/18/2013    Cath: early  mLAD 100% after mod D1, mRCA 100% (L-R collaterals), Om1 80% ostial --> PTCA of LAD --> CABG x 5 ; Echo: EF 35-40% - hypokinesis of inferior, inferoseptal and distal posterior walls; apical dyskinesis  . 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 Laneta Simmers): LIMA-LAD, SVG-rAM, SeqSVG-RPDA-PL, SVG-OM  . Heavy smoker     3PPD  . Essential hypertension   . Dyslipidemia, goal LDL below 70   . CHF (congestive heart failure)     Current Outpatient Prescriptions  Medication Sig Dispense Refill  . amoxicillin (AMOXIL) 500 MG capsule Take 2 capsules (1,000 mg total) by mouth 2 (two) times daily.  40 capsule  0  . aspirin EC 325 MG EC tablet Take 1 tablet (325 mg total) by mouth daily.  30 tablet  0  . atorvastatin (LIPITOR) 40 MG tablet Take 1 tablet (40 mg total) by mouth daily at 6 PM.  30 tablet  1  . clarithromycin (BIAXIN) 500 MG tablet Take 1 tablet (500 mg total) by mouth 2 (two) times daily.  20 tablet  0  . docusate sodium (COLACE) 100 MG capsule Take 100 mg by mouth daily.      . magnesium hydroxide (MILK OF MAGNESIA) 400 MG/5ML suspension Take 30 mLs by mouth daily as needed for moderate constipation.      . metoprolol tartrate (LOPRESSOR) 25 MG tablet Take 1 tablet (25 mg total) by mouth 2 (two) times daily.  60 tablet  1  . Misc Natural Products (LAXATIVE  FORMULA) TABS Take 1 tablet by mouth daily as needed (for constipation).      . pantoprazole (PROTONIX) 40 MG tablet Take 1 tablet (40 mg total) by mouth daily.  30 tablet  0   No current facility-administered medications for this visit.     Allergies:   Review of patient's allergies indicates no known allergies.   Social History:  The patient  reports that he has been smoking.  He does not have any smokeless tobacco history on file. He reports that he does not drink alcohol or use illicit drugs.   Family History:  The patient's family history includes CAD in his other; Cancer in his father and other; Heart attack in an other family member; Heart  disease in his mother; Hypertension in his father; Stroke in his father. There is no history of Heart attack.   ROS:  Please see the history of present illness.       All other systems reviewed and negative.   PHYSICAL EXAM: VS:  BP 110/75  Pulse 95  Ht 6' (1.829 m)  Wt 220 lb (99.791 kg)  BMI 29.83 kg/m2 Well nourished, well developed, in no acute distress HEENT: normal Neck:  no JVD Cardiac:  normal S1, S2; RRR; no murmur Chest: Median sternotomy wound well healed without erythema or discharge Lungs:  clear to auscultation bilaterally, no wheezing, rhonchi or rales Abd: soft, nontender, no hepatomegaly Ext: very trace bilateral LE edema Skin: warm and dry Neuro:  CNs 2-12 intact, no focal abnormalities noted  EKG:  NSR, HR 95, rightward axis, T wave inversions in V1 through V4, no significant changes     ASSESSMENT AND PLAN:  Atherosclerosis of native coronary artery of native heart without angina pectoris:  He is progressing well after recent CABG. Refer to cardiac rehabilitation. Continue aspirin, statin, beta blocker.  Ischemic Cardiomyopathy:  Heart rate is somewhat elevated. I will increase metoprolol to 37.5 mg twice a day. In follow up, I will try to start an ACE inhibitor.  He will need a followup echocardiogram 90 days after his bypass. If EF less than 35%, he will need referral to EP.  Unspecified essential hypertension:  Controlled.  Continue current regimen.    Other and unspecified hyperlipidemia:  Continue statin. Check lipids and LFTs in 6 weeks.  PUD (peptic ulcer disease):  His Lipitor was stopped while he is on Biaxin. I have asked him to resume this once he has completed his antibiotic regimen.   Disposition:  FU with me in 3 weeks and Dr. Armanda Magicraci Turner in October after the FU echo.    Signed, Brynda RimScott Hoke Baer, PA-C, MHS 01/15/2014 3:38 PM    [facility] [address], [place], [place]  [address] Phone: [phone]; Fax: [phone]  [phone]

## 2014-01-15 NOTE — Patient Instructions (Signed)
INCREASE METOPROLOL TO 37.5 MG TWICE DAILY (THIS WILL BE 1 AND 1/2 TABS TWICE DAILY)  FASTING CHOLESTEROL PANEL TO BE DONE IN 6 WEEKS  Your physician has requested that you have an echocardiogram. Echocardiography is a painless test that uses sound waves to create images of your heart. It provides your doctor with information about the size and shape of your heart and how well your heart's chambers and valves are working. This procedure takes approximately one hour. There are no restrictions for this procedure.  You have been referred to CARDIAC REHAB AT   Your physician recommends that you schedule a follow-up appointment in: 2-3 WEEKS WITH SCOTT WEAVER, PAC SAME DAY DR. Mayford Knife IS IN THE OFFICE  Your physician recommends that you schedule a follow-up appointment in: 03/2014 WITH DR. Mayford Knife ONCE ECHO HAS BEEN COMPLETED

## 2014-01-20 ENCOUNTER — Other Ambulatory Visit (HOSPITAL_COMMUNITY): Payer: Self-pay

## 2014-01-21 ENCOUNTER — Ambulatory Visit: Payer: Self-pay | Admitting: Surgery

## 2014-01-22 ENCOUNTER — Encounter (HOSPITAL_COMMUNITY): Payer: Self-pay | Admitting: Emergency Medicine

## 2014-01-22 ENCOUNTER — Ambulatory Visit: Payer: Self-pay | Attending: Family Medicine

## 2014-01-22 DIAGNOSIS — Z792 Long term (current) use of antibiotics: Secondary | ICD-10-CM | POA: Insufficient documentation

## 2014-01-22 DIAGNOSIS — Z7982 Long term (current) use of aspirin: Secondary | ICD-10-CM | POA: Insufficient documentation

## 2014-01-22 DIAGNOSIS — J159 Unspecified bacterial pneumonia: Secondary | ICD-10-CM | POA: Insufficient documentation

## 2014-01-22 DIAGNOSIS — I509 Heart failure, unspecified: Secondary | ICD-10-CM | POA: Insufficient documentation

## 2014-01-22 DIAGNOSIS — Z9889 Other specified postprocedural states: Secondary | ICD-10-CM | POA: Insufficient documentation

## 2014-01-22 DIAGNOSIS — I252 Old myocardial infarction: Secondary | ICD-10-CM | POA: Insufficient documentation

## 2014-01-22 DIAGNOSIS — Z951 Presence of aortocoronary bypass graft: Secondary | ICD-10-CM | POA: Insufficient documentation

## 2014-01-22 DIAGNOSIS — Z9861 Coronary angioplasty status: Secondary | ICD-10-CM | POA: Insufficient documentation

## 2014-01-22 DIAGNOSIS — R110 Nausea: Secondary | ICD-10-CM | POA: Insufficient documentation

## 2014-01-22 DIAGNOSIS — E785 Hyperlipidemia, unspecified: Secondary | ICD-10-CM | POA: Insufficient documentation

## 2014-01-22 DIAGNOSIS — I10 Essential (primary) hypertension: Secondary | ICD-10-CM | POA: Insufficient documentation

## 2014-01-22 DIAGNOSIS — I2510 Atherosclerotic heart disease of native coronary artery without angina pectoris: Secondary | ICD-10-CM | POA: Insufficient documentation

## 2014-01-22 DIAGNOSIS — F17200 Nicotine dependence, unspecified, uncomplicated: Secondary | ICD-10-CM | POA: Insufficient documentation

## 2014-01-22 DIAGNOSIS — R1013 Epigastric pain: Secondary | ICD-10-CM | POA: Insufficient documentation

## 2014-01-22 DIAGNOSIS — Z79899 Other long term (current) drug therapy: Secondary | ICD-10-CM | POA: Insufficient documentation

## 2014-01-22 LAB — URINALYSIS, ROUTINE W REFLEX MICROSCOPIC
Bilirubin Urine: NEGATIVE
GLUCOSE, UA: NEGATIVE mg/dL
HGB URINE DIPSTICK: NEGATIVE
Ketones, ur: NEGATIVE mg/dL
LEUKOCYTES UA: NEGATIVE
Nitrite: NEGATIVE
PROTEIN: NEGATIVE mg/dL
SPECIFIC GRAVITY, URINE: 1.028 (ref 1.005–1.030)
UROBILINOGEN UA: 0.2 mg/dL (ref 0.0–1.0)
pH: 5.5 (ref 5.0–8.0)

## 2014-01-22 LAB — COMPREHENSIVE METABOLIC PANEL
ALBUMIN: 3.2 g/dL — AB (ref 3.5–5.2)
ALT: 27 U/L (ref 0–53)
AST: 15 U/L (ref 0–37)
Alkaline Phosphatase: 117 U/L (ref 39–117)
Anion gap: 15 (ref 5–15)
BILIRUBIN TOTAL: 0.4 mg/dL (ref 0.3–1.2)
BUN: 11 mg/dL (ref 6–23)
CHLORIDE: 100 meq/L (ref 96–112)
CO2: 24 meq/L (ref 19–32)
Calcium: 9.6 mg/dL (ref 8.4–10.5)
Creatinine, Ser: 0.81 mg/dL (ref 0.50–1.35)
GFR calc Af Amer: >90 mL/min (ref >90)
Glucose, Bld: 96 mg/dL (ref 70–99)
POTASSIUM: 3.8 meq/L (ref 3.7–5.3)
SODIUM: 139 meq/L (ref 137–147)
Total Protein: 7.9 g/dL (ref 6.0–8.3)

## 2014-01-22 LAB — CBC WITH DIFFERENTIAL/PLATELET
BASOS ABS: 0 10*3/uL (ref 0.0–0.1)
BASOS PCT: 0 % (ref 0–1)
Eosinophils Absolute: 1.5 10*3/uL — ABNORMAL HIGH (ref 0.0–0.7)
Eosinophils Relative: 13 % — ABNORMAL HIGH (ref 0–5)
HCT: 42.9 % (ref 39.0–52.0)
Hemoglobin: 14.6 g/dL (ref 13.0–17.0)
LYMPHS PCT: 19 % (ref 12–46)
Lymphs Abs: 2.2 10*3/uL (ref 0.7–4.0)
MCH: 29.7 pg (ref 26.0–34.0)
MCHC: 34 g/dL (ref 30.0–36.0)
MCV: 87.2 fL (ref 78.0–100.0)
Monocytes Absolute: 0.8 10*3/uL (ref 0.1–1.0)
Monocytes Relative: 7 % (ref 3–12)
NEUTROS ABS: 7.1 10*3/uL (ref 1.7–7.7)
NEUTROS PCT: 61 % (ref 43–77)
Platelets: 385 10*3/uL (ref 150–400)
RBC: 4.92 MIL/uL (ref 4.22–5.81)
RDW: 13.9 % (ref 11.5–15.5)
WBC: 11.6 10*3/uL — ABNORMAL HIGH (ref 4.0–10.5)

## 2014-01-22 LAB — LIPASE, BLOOD: LIPASE: 17 U/L (ref 11–59)

## 2014-01-22 LAB — I-STAT TROPONIN, ED: Troponin i, poc: 0.01 ng/mL (ref 0.00–0.08)

## 2014-01-22 MED ORDER — ONDANSETRON 4 MG PO TBDP
4.0000 mg | ORAL_TABLET | Freq: Once | ORAL | Status: AC
  Administered 2014-01-22: 4 mg via ORAL
  Filled 2014-01-22: qty 1

## 2014-01-22 MED ORDER — ONDANSETRON 4 MG PO TBDP
8.0000 mg | ORAL_TABLET | Freq: Once | ORAL | Status: DC
  Filled 2014-01-22: qty 2

## 2014-01-22 NOTE — ED Notes (Signed)
Pt reports awakening today with nausea, central upper abdominal "burning" and left lower back pain. Pt recently admitted for stomach ulcer. Pt denies CP, SOB. PT AO x4. LBM today, normal. Denies fever/chills. NAD.

## 2014-01-23 ENCOUNTER — Encounter (HOSPITAL_COMMUNITY): Payer: Self-pay

## 2014-01-23 ENCOUNTER — Emergency Department (HOSPITAL_COMMUNITY)
Admission: EM | Admit: 2014-01-23 | Discharge: 2014-01-23 | Disposition: A | Discharge status: Home / Self Care | Payer: Self-pay | Attending: Emergency Medicine | Admitting: Emergency Medicine

## 2014-01-23 ENCOUNTER — Emergency Department (HOSPITAL_COMMUNITY): Payer: Self-pay

## 2014-01-23 ENCOUNTER — Other Ambulatory Visit: Payer: Self-pay | Admitting: Surgery

## 2014-01-23 DIAGNOSIS — R109 Unspecified abdominal pain: Secondary | ICD-10-CM

## 2014-01-23 DIAGNOSIS — J189 Pneumonia, unspecified organism: Secondary | ICD-10-CM

## 2014-01-23 DIAGNOSIS — I2589 Other forms of chronic ischemic heart disease: Secondary | ICD-10-CM

## 2014-01-23 DIAGNOSIS — R1013 Epigastric pain: Secondary | ICD-10-CM

## 2014-01-23 MED ORDER — DOXYCYCLINE HYCLATE 100 MG PO CAPS: 100.0000 mg | ORAL_CAPSULE | Freq: Two times a day (BID) | ORAL | Status: DC

## 2014-01-23 MED ORDER — HYDROCODONE-ACETAMINOPHEN 5-325 MG PO TABS
2.0000 | ORAL_TABLET | Freq: Once | ORAL | Status: AC
  Administered 2014-01-23: 2 via ORAL
  Filled 2014-01-23: qty 2

## 2014-01-23 MED ORDER — FENTANYL CITRATE 0.05 MG/ML IJ SOLN: 50.0000 ug | INTRAMUSCULAR | Status: DC | PRN

## 2014-01-23 NOTE — Discharge Instructions (Signed)
See your primary doctor and gastroenterologist for further evaluation and to discuss scope of stomach.  If you were given medicines take as directed.  If you are on coumadin or contraceptives realize their levels and effectiveness is altered by many different medicines.  If you have any reaction (rash, tongues swelling, other) to the medicines stop taking and see a physician.   Please follow up as directed and return to the ER or see a physician for new or worsening symptoms.  Thank you. Filed Vitals:   01/23/14 0315 01/23/14 0345 01/23/14 0415 01/23/14 0500  BP: 120/73 113/77 112/80 123/76  Pulse: 86 85 90 90  Temp:      TempSrc:      Resp: 26 25 25    SpO2: 94% 93% 94% 94%   Stent of the sun while taking antibiotics.

## 2014-01-23 NOTE — ED Provider Notes (Signed)
CSN: [id number]     Arrival date & time 01/22/14  2030 History   First MD Initiated Contact with Patient 01/23/14 0053     Chief Complaint  Patient presents with  . Nausea  . Abdominal Pain     (Consider location/radiation/quality/duration/timing/severity/associated sxs/prior Treatment) HPI Comments: 53 year old male smoker, recent CABG 5 bypass, heart attack history, pyloric ulcer, cardiomyopathy, high blood pressure presents with epigastric and left flank pain. Patient has had gradually worsening epigastric pain with mild radiation the back similar to his ulcer history. No recent EGD however patient is following closely with gastroenterology. Patient has had darker stools but no obvious brighter blood. Patient said left flank pain without focal radiation, no history of kidney stones, no urinary symptoms. Patient denies fevers chills or vomiting. Patient denies chest pain or shortness of breath and feels she's doing well since his bypass.  Patient is a 53 y.o. male presenting with abdominal pain. The history is provided by the patient.  Abdominal Pain Associated symptoms: cough and nausea   Associated symptoms: no chest pain, no chills, no dysuria, no fever, no shortness of breath and no vomiting     Past Medical History  Diagnosis Date  . MI (myocardial infarction)   . CAD (coronary artery disease), native coronary artery   . ST elevation (STEMI) myocardial infarction involving left anterior descending coronary artery 12/18/2013    Cath: early mLAD 100% after mod D1, mRCA 100% (L-R collaterals), Om1 80% ostial --> PTCA of LAD --> CABG x 5 ; Echo: EF 35-40% - hypokinesis of inferior, inferoseptal and distal posterior walls; apical dyskinesis  . 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 Laneta SimmersBartle): LIMA-LAD, SVG-rAM, SeqSVG-RPDA-PL, SVG-OM  . Heavy smoker     3PPD  . Essential hypertension   . Dyslipidemia, goal LDL below 70   . CHF (congestive heart failure)    Past Surgical History  Procedure  Laterality Date  . Coronary angioplasty with stent placement    . Fracture surgery    . Coronary artery bypass graft N/A 12/18/2013    Procedure: CORONARY ARTERY BYPASS GRAFTING (CABG);  Surgeon: Alleen BorneBryan K Bartle, MD;  [facility];  Service: Open Heart Surgery;  Laterality: N/A;  . Intraoperative transesophageal echocardiogram N/A 12/18/2013    Procedure: INTRAOPERATIVE TRANSESOPHAGEAL ECHOCARDIOGRAM;  Surgeon: Alleen BorneBryan K Bartle, MD;  [facility];  Service: Open Heart Surgery;  Laterality: N/A;   Family History  Problem Relation Age of Onset  . Cancer Other   . CAD Other   . Cancer Father   . Hypertension Father   . Stroke Father   . Heart disease Mother   . Heart attack    . Heart attack Neg Hx    History  Substance Use Topics  . Smoking status: Current Every Day Smoker -- 3.00 packs/day  . Smokeless tobacco: Not on file     Comment: states he quit smoking 12/17/13 but was seen smoking outside clinic  . Alcohol Use: No    Review of Systems  Constitutional: Negative for fever and chills.  HENT: Negative for congestion.   Eyes: Negative for visual disturbance.  Respiratory: Positive for cough. Negative for shortness of breath.   Cardiovascular: Negative for chest pain.  Gastrointestinal: Positive for nausea and abdominal pain. Negative for vomiting.  Genitourinary: Positive for flank pain. Negative for dysuria.  Musculoskeletal: Negative for back pain, neck pain and neck stiffness.  Skin: Negative for rash.  Neurological: Negative for light-headedness and headaches.  Allergies  Review of patient's allergies indicates no known allergies.  Home Medications   Prior to Admission medications   Medication Sig Start Date End Date Taking? Authorizing Provider  aspirin EC 325 MG tablet Take 325 mg by mouth daily.   Yes Historical Provider, MD  atorvastatin (LIPITOR) 40 MG tablet Take 40 mg by mouth daily.   Yes Historical Provider, MD  clarithromycin (BIAXIN) 500 MG  tablet Take 500 mg by mouth 2 (two) times daily.   Yes Historical Provider, MD  docusate sodium (COLACE) 100 MG capsule Take 100 mg by mouth daily.   Yes Historical Provider, MD  metoprolol tartrate (LOPRESSOR) 25 MG tablet Take 37.5 mg by mouth 2 (two) times daily.   Yes Historical Provider, MD  Misc Natural Products (LAXATIVE FORMULA) TABS Take 1 tablet by mouth daily as needed (for constipation).   Yes Historical Provider, MD  pantoprazole (PROTONIX) 40 MG tablet Take 40 mg by mouth daily.   Yes Historical Provider, MD  amoxicillin (AMOXIL) 500 MG capsule Take 1,000 mg by mouth 2 (two) times daily. x10 days    Historical Provider, MD   BP 113/70  Pulse 89  Temp(Src) 97.8 F (36.6 C) (Oral)  Resp 28  SpO2 93% Physical Exam  Nursing note and vitals reviewed. Constitutional: He is oriented to person, place, and time. He appears well-developed and well-nourished.  HENT:  Head: Normocephalic and atraumatic.  Eyes: Conjunctivae are normal. Right eye exhibits no discharge. Left eye exhibits no discharge.  Neck: Normal range of motion. Neck supple. No tracheal deviation present.  Cardiovascular: Normal rate and regular rhythm.   Pulmonary/Chest: Effort normal and breath sounds normal.  Abdominal: Soft. He exhibits no distension. There is tenderness (epigastric and left flank). There is no guarding.  Musculoskeletal: He exhibits no edema.  Neurological: He is alert and oriented to person, place, and time.  Skin: Skin is warm. No rash noted.  Psychiatric: He has a normal mood and affect.    ED Course  Procedures (including critical care time) Labs Review Labs Reviewed  CBC WITH DIFFERENTIAL - Abnormal; Notable for the following:    WBC 11.6 (*)    Eosinophils Relative 13 (*)    Eosinophils Absolute 1.5 (*)    All other components within normal limits  COMPREHENSIVE METABOLIC PANEL - Abnormal; Notable for the following:    Albumin 3.2 (*)    All other components within normal limits   URINALYSIS, ROUTINE W REFLEX MICROSCOPIC - Abnormal; Notable for the following:    Color, Urine AMBER (*)    APPearance CLOUDY (*)    All other components within normal limits  LIPASE, BLOOD  I-STAT TROPOININ, ED    Imaging Review No results found.   EKG Interpretation   Date/Time:  Thursday January 22 2014 21:09:45 EDT Ventricular Rate:  110 PR Interval:  132 QRS Duration: 84 QT Interval:  320 QTC Calculation: 433 R Axis:   94 Text Interpretation:  Sinus tachycardia with occasional Premature  ventricular complexes Rightward axis Borderline ECG Confirmed by Jaxx Huish   MD, Srijan Givan ([id number]) on 01/23/2014 1:43:53 AM      MDM   Final diagnoses:  Community acquired pneumonia  Epigastric pain  Acute left flank pain   Patient well-appearing with benign abdominal exam mild epigastric tenderness similar to his previous ulcers. Hemoglobin okay in ER and vitals unremarkable. Patient's concern is new left flank pain today. CT scan to look for signs of kidney stone or other acute findings. Patient has close  followup outpatient cardiology and gastroenterology is comfortable with that plan.  Results and differential diagnosis were discussed with the patient/parent/guardian. Close follow up outpatient was discussed, comfortable with the plan.   Medications  fentaNYL (SUBLIMAZE) injection 50 mcg (not administered)  ondansetron (ZOFRAN-ODT) disintegrating tablet 4 mg (4 mg Oral Given 01/22/14 2306)  HYDROcodone-acetaminophen (NORCO/VICODIN) 5-325 MG per tablet 2 tablet (2 tablets Oral Given 01/23/14 0207)    Filed Vitals:   01/22/14 2111 01/23/14 0045 01/23/14 0145 01/23/14 0200  BP: 143/86 112/68 110/67 113/70  Pulse: 106 97 90 89  Temp: 97.8 F (36.6 C)     TempSrc: Oral     Resp: 18 28 27 28   SpO2: 97% 93% 94% 93%         Enid Skeens, MD 01/23/14 [phone]

## 2014-01-23 NOTE — ED Notes (Signed)
Pt A&OX4, ambulatory at d/c with steady gait, NAD, thanking staff for the care.

## 2014-01-23 NOTE — ED Notes (Signed)
Pt reports having open heart surgery on December 18, 2013 due to a hear attack.

## 2014-01-28 ENCOUNTER — Encounter: Payer: Self-pay | Admitting: Surgery

## 2014-01-28 ENCOUNTER — Ambulatory Visit (INDEPENDENT_AMBULATORY_CARE_PROVIDER_SITE_OTHER): Payer: Self-pay | Admitting: Surgery

## 2014-01-28 ENCOUNTER — Ambulatory Visit
Admission: RE | Admit: 2014-01-28 | Discharge: 2014-01-28 | Disposition: A | Discharge status: Home / Self Care | Payer: Self-pay | Source: Ambulatory Visit | Attending: Surgery | Admitting: Surgery

## 2014-01-28 VITALS — BP 118/81 | HR 99 | Ht 72.0 in | Wt 220.0 lb

## 2014-01-28 DIAGNOSIS — I2589 Other forms of chronic ischemic heart disease: Secondary | ICD-10-CM

## 2014-01-28 DIAGNOSIS — Z951 Presence of aortocoronary bypass graft: Secondary | ICD-10-CM

## 2014-01-28 NOTE — Progress Notes (Signed)
      HPI:  Patient returns for routine postoperative follow-up having undergone CABG x 5 on 12/19/2013. The patient's early postoperative recovery while in the hospital was notable for an uncomplicated postop course. Since hospital discharge the patient reports that he was readmitted for abdominal pain and nausea and diagnosed with a pyloric channel ulcer and H Pylori for which he was treated. He was also diagnosed with pneumonia. He says he is now feeling well. 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 He did mow his grass today with a push mower. He is still smoking but trying to switch to vapor.   Current Outpatient Prescriptions  Medication Sig Dispense Refill  . aspirin EC 325 MG tablet Take 325 mg by mouth daily.      Marland Kitchen atorvastatin (LIPITOR) 40 MG tablet Take 40 mg by mouth daily.      . clarithromycin (BIAXIN) 500 MG tablet Take 500 mg by mouth 2 (two) times daily.      Marland Kitchen docusate sodium (COLACE) 100 MG capsule Take 100 mg by mouth daily.      Marland Kitchen doxycycline (VIBRAMYCIN) 100 MG capsule Take 1 capsule (100 mg total) by mouth 2 (two) times daily. One po bid x 7 days  14 capsule  0  . metoprolol tartrate (LOPRESSOR) 25 MG tablet Take 37.5 mg by mouth 2 (two) times daily.      . Misc Natural Products (LAXATIVE FORMULA) TABS Take 1 tablet by mouth daily as needed (for constipation).      . pantoprazole (PROTONIX) 40 MG tablet Take 40 mg by mouth daily.       No current facility-administered medications for this visit.    Physical Exam: BP 118/81  Pulse 99  Ht 6' (1.829 m)  Wt 220 lb (99.791 kg)  BMI 29.83 kg/m2  SpO2 97% He looks well. Lung exam is clear. Cardiac exam shows a regular rate and rhythm with normal heart sounds. Chest incision is healing well and sternum is stable. The leg incisions are healing well and there is no peripheral edema.    Diagnostic Tests:  CLINICAL DATA: Ischemic cardiomyopathy  EXAM:  CHEST 2 VIEW    COMPARISON: 01/01/2014  FINDINGS:  Prior CABG. Heart size is normal. Negative for heart failure.  Small left pleural effusion has developed since the prior study.  Negative for edema. Negative for pneumonia. Mild left lower lobe  atelectasis again noted.  IMPRESSION:  Interval development of small left pleural effusion. Negative for  heart failure or edema.  Electronically Signed  By: Marlan Palau M.D.  On: 01/28/2014 15:56   Impression:  Overall I think he is doing well. I encouraged him to continue walking.  I told him he could drive his car but should not lift anything heavier than 10 lbs for three months postop. He has already returned to work and doing yard work so he is probably doing more than he should. I strongly encouraged him to quit smoking.    Plan:  He is going to follow up with Dr. Mayford Knife and will contact me if he has any problems with his incisions.

## 2014-02-04 ENCOUNTER — Ambulatory Visit (INDEPENDENT_AMBULATORY_CARE_PROVIDER_SITE_OTHER): Payer: Self-pay | Admitting: Physician Assistant

## 2014-02-04 ENCOUNTER — Encounter: Payer: Self-pay | Admitting: Physician Assistant

## 2014-02-04 VITALS — BP 122/78 | HR 75 | Ht 72.0 in | Wt 223.0 lb

## 2014-02-04 DIAGNOSIS — E785 Hyperlipidemia, unspecified: Secondary | ICD-10-CM

## 2014-02-04 DIAGNOSIS — I2589 Other forms of chronic ischemic heart disease: Secondary | ICD-10-CM

## 2014-02-04 DIAGNOSIS — I2510 Atherosclerotic heart disease of native coronary artery without angina pectoris: Secondary | ICD-10-CM

## 2014-02-04 DIAGNOSIS — I10 Essential (primary) hypertension: Secondary | ICD-10-CM

## 2014-02-04 MED ORDER — LISINOPRIL 2.5 MG PO TABS: 2.5000 mg | ORAL_TABLET | Freq: Every day | ORAL | Status: DC

## 2014-02-04 NOTE — Progress Notes (Signed)
Cardiology Office Note    Date:  02/04/2014   ID:  Lee May, DOB 11/30/1960, MRN [id number]  PCP:  Doris Cheadle, MD  Cardiologist:  Dr. Armanda Magic      History of Present Illness: Lee May is a 53 y.o. male with a hx of CAD s/p remote MI with PCI in [facility] 2009, HTN, HL.  He was admitted 12/2013 with an ant-lat STEMI.  Emergent cardiac catheterization demonstrated 3 vessel CAD with acute occlusion of the LAD. LAD was treated with POBA. Echo demonstrated EF of 35-40%.  He underwent CABG with Dr. Laneta Simmers on 12/19/13 (LIMA-LAD, SVG-OM1, SVG-AM, SVG-PDA/PL).  He was readmitted 7/30-8/1 with acute pyloric channel ulcer. He was treated with Protonix.  I saw him 8/13 in FU.  I adjusted his beta blocker.  He returns for further medication titration.  Since last seen, he went to the ED and was dx with CAP >>> tx with doxycycline.    He returns for FU.  He is doing well.  The patient denies chest pain, shortness of breath, syncope, orthopnea, PND or significant pedal edema.  He is back to work [employer]) a few hours a day.        Studies:  - LHC (7/15):  LAD occluded, RCA occluded, OM1 70-80% >>> POBA to mid LAD, EF 45% inf-apical HK  - Echo (7/15):  EF 35-40%, inf, inf-septal and dist post HK, mild LVH, Gr 1 DD.   Recent Labs/Images: 12/18/2013: TSH 0.431  01/22/2014: ALT 27; Creatinine 0.81; Hemoglobin 14.6; Potassium 3.8    Wt Readings from Last 3 Encounters:  02/04/14 223 lb (101.152 kg)  01/28/14 220 lb (99.791 kg)  01/15/14 220 lb (99.791 kg)     Past Medical History  Diagnosis Date  . MI (myocardial infarction)   . CAD (coronary artery disease), native coronary artery   . ST elevation (STEMI) myocardial infarction involving left anterior descending coronary artery 12/18/2013    Cath: early mLAD 100% after mod D1, mRCA 100% (L-R collaterals), Om1 80% ostial --> PTCA of LAD --> CABG x 5 ; Echo: EF 35-40% - hypokinesis of inferior, inferoseptal and distal posterior  walls; apical dyskinesis  . 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 Laneta Simmers): LIMA-LAD, SVG-rAM, SeqSVG-RPDA-PL, SVG-OM  . Heavy smoker     3PPD  . Essential hypertension   . Dyslipidemia, goal LDL below 70   . CHF (congestive heart failure)     Current Outpatient Prescriptions  Medication Sig Dispense Refill  . aspirin EC 325 MG tablet Take 325 mg by mouth daily.      Marland Kitchen atorvastatin (LIPITOR) 40 MG tablet Take 40 mg by mouth daily.      . metoprolol tartrate (LOPRESSOR) 25 MG tablet Take 37.5 mg by mouth 2 (two) times daily.       No current facility-administered medications for this visit.     Allergies:   Review of patient's allergies indicates no known allergies.   Social History:  The patient  reports that he has been smoking.  He does not have any smokeless tobacco history on file. He reports that he does not drink alcohol or use illicit drugs.   Family History:  The patient's family history includes CAD in his other; Cancer in his father and other; Heart attack in an other family member; Heart disease in his mother; Hypertension in his father; Stroke in his father. There is no history of Heart attack.   ROS:  Please see  the history of present illness.  No significant cough.   All other systems reviewed and negative.   PHYSICAL EXAM: VS:  BP 122/78  Pulse 75  Ht 6' (1.829 m)  Wt 223 lb (101.152 kg)  BMI 30.24 kg/m2 Well nourished, well developed, in no acute distress HEENT: normal Neck:  no JVD Cardiac:  normal S1, S2; RRR; no murmur Lungs:  Very faint crackles L base, no wheezing Abd: soft, nontender, no hepatomegaly Ext:  No LE edema Skin: warm and dry Neuro:  CNs 2-12 intact, no focal abnormalities noted  EKG:  NSR, HR 75, no acute changes      ASSESSMENT AND PLAN:  1. Atherosclerosis of native coronary artery of native heart without angina pectoris:  Doing well.  No angina.  Continue aspirin, statin, beta blocker. 2. Ischemic Cardiomyopathy:  Add Lisinopril 2.5 mg QD.   Check BMET in 1 week.  Continue beta blocker.  Plan FU echocardiogram 90 days after his bypass. If EF less than 35%, he will need referral to EP. 3. Unspecified essential hypertension:  Controlled.  4. Other and unspecified hyperlipidemia:  Continue statin.  5. PUD (peptic ulcer disease):  Improved.  FU with GI as planned.  6. Disposition:  FU with Dr. Armanda Magic in October after the FU echo.    Signed, Brynda Rim, MHS 02/04/2014 9:16 AM    [facility] [address] [place], [place], [place]  [address] Phone: [phone]; Fax: [phone]

## 2014-02-04 NOTE — Patient Instructions (Signed)
Your physician has recommended you make the following change in your medication:  1) START Lisinopril 2.5mg  daily an Rx has been sent to your pharmacy  Your physician recommends that you return for lab work in: 1 week 02/11/14 between 7:30am-5:15pm  Keep your follow up  And echo appointment scheduled for October 2015

## 2014-02-05 ENCOUNTER — Encounter: Payer: Self-pay | Admitting: Gastroenterology

## 2014-02-11 ENCOUNTER — Other Ambulatory Visit: Payer: Self-pay

## 2014-02-11 ENCOUNTER — Other Ambulatory Visit (INDEPENDENT_AMBULATORY_CARE_PROVIDER_SITE_OTHER): Payer: Self-pay

## 2014-02-11 DIAGNOSIS — I10 Essential (primary) hypertension: Secondary | ICD-10-CM

## 2014-02-11 LAB — BASIC METABOLIC PANEL
BUN: 15 mg/dL (ref 6–23)
CALCIUM: 8.9 mg/dL (ref 8.4–10.5)
CO2: 25 meq/L (ref 19–32)
Chloride: 105 mEq/L (ref 96–112)
Creatinine, Ser: 1.1 mg/dL (ref 0.4–1.5)
GFR: 77.39 mL/min (ref >60.00)
GLUCOSE: 74 mg/dL (ref 70–99)
POTASSIUM: 3.7 meq/L (ref 3.5–5.1)
SODIUM: 139 meq/L (ref 135–145)

## 2014-02-11 MED ORDER — ATORVASTATIN CALCIUM 40 MG PO TABS: 40.0000 mg | ORAL_TABLET | Freq: Every day | ORAL | Status: DC

## 2014-02-26 ENCOUNTER — Other Ambulatory Visit: Payer: Self-pay

## 2014-03-02 ENCOUNTER — Other Ambulatory Visit (INDEPENDENT_AMBULATORY_CARE_PROVIDER_SITE_OTHER): Payer: Self-pay | Admitting: *Deleted

## 2014-03-02 ENCOUNTER — Other Ambulatory Visit: Payer: Self-pay | Admitting: *Deleted

## 2014-03-02 DIAGNOSIS — E785 Hyperlipidemia, unspecified: Secondary | ICD-10-CM

## 2014-03-02 LAB — HEPATIC FUNCTION PANEL
ALT: 18 U/L (ref 0–53)
AST: 16 U/L (ref 0–37)
Albumin: 3.6 g/dL (ref 3.5–5.2)
Alkaline Phosphatase: 88 U/L (ref 39–117)
BILIRUBIN DIRECT: 0 mg/dL (ref 0.0–0.3)
Total Bilirubin: 0.5 mg/dL (ref 0.2–1.2)
Total Protein: 7.6 g/dL (ref 6.0–8.3)

## 2014-03-02 LAB — LIPID PANEL
CHOL/HDL RATIO: 5
Cholesterol: 187 mg/dL (ref 0–200)
HDL: 37 mg/dL — AB (ref >39.00)
LDL Cholesterol: 127 mg/dL — ABNORMAL HIGH (ref 0–99)
NonHDL: 150
Triglycerides: 114 mg/dL (ref 0.0–149.0)
VLDL: 22.8 mg/dL (ref 0.0–40.0)

## 2014-03-02 MED ORDER — ATORVASTATIN CALCIUM 80 MG PO TABS: 80.0000 mg | ORAL_TABLET | Freq: Every day | ORAL | Status: DC

## 2014-03-11 ENCOUNTER — Ambulatory Visit: Payer: Self-pay | Admitting: Internal Medicine

## 2014-03-20 ENCOUNTER — Ambulatory Visit (HOSPITAL_COMMUNITY): Payer: Self-pay | Attending: Cardiovascular Disease | Admitting: Radiology

## 2014-03-20 ENCOUNTER — Telehealth: Payer: Self-pay | Admitting: Physician Assistant

## 2014-03-20 ENCOUNTER — Encounter: Payer: Self-pay | Admitting: Physician Assistant

## 2014-03-20 DIAGNOSIS — I255 Ischemic cardiomyopathy: Secondary | ICD-10-CM | POA: Insufficient documentation

## 2014-03-20 DIAGNOSIS — E785 Hyperlipidemia, unspecified: Secondary | ICD-10-CM | POA: Insufficient documentation

## 2014-03-20 DIAGNOSIS — I10 Essential (primary) hypertension: Secondary | ICD-10-CM | POA: Insufficient documentation

## 2014-03-20 DIAGNOSIS — I259 Chronic ischemic heart disease, unspecified: Secondary | ICD-10-CM

## 2014-03-20 DIAGNOSIS — Z720 Tobacco use: Secondary | ICD-10-CM | POA: Insufficient documentation

## 2014-03-20 NOTE — Progress Notes (Signed)
Echocardiogram performed.  

## 2014-03-20 NOTE — Telephone Encounter (Signed)
New message     Returning a nurses call.  Pt thinks it was from [employer] office

## 2014-03-22 ENCOUNTER — Encounter (HOSPITAL_COMMUNITY): Payer: Self-pay | Admitting: Emergency Medicine

## 2014-03-22 ENCOUNTER — Emergency Department (HOSPITAL_COMMUNITY): Payer: Self-pay

## 2014-03-22 ENCOUNTER — Emergency Department (HOSPITAL_COMMUNITY)
Admission: EM | Admit: 2014-03-22 | Discharge: 2014-03-22 | Disposition: A | Discharge status: Home / Self Care | Payer: Self-pay | Attending: Emergency Medicine | Admitting: Emergency Medicine

## 2014-03-22 DIAGNOSIS — Z8701 Personal history of pneumonia (recurrent): Secondary | ICD-10-CM | POA: Insufficient documentation

## 2014-03-22 DIAGNOSIS — R062 Wheezing: Secondary | ICD-10-CM

## 2014-03-22 DIAGNOSIS — E785 Hyperlipidemia, unspecified: Secondary | ICD-10-CM | POA: Insufficient documentation

## 2014-03-22 DIAGNOSIS — J441 Chronic obstructive pulmonary disease with (acute) exacerbation: Secondary | ICD-10-CM | POA: Insufficient documentation

## 2014-03-22 DIAGNOSIS — I10 Essential (primary) hypertension: Secondary | ICD-10-CM | POA: Insufficient documentation

## 2014-03-22 DIAGNOSIS — J209 Acute bronchitis, unspecified: Secondary | ICD-10-CM | POA: Insufficient documentation

## 2014-03-22 DIAGNOSIS — I2510 Atherosclerotic heart disease of native coronary artery without angina pectoris: Secondary | ICD-10-CM | POA: Insufficient documentation

## 2014-03-22 DIAGNOSIS — I252 Old myocardial infarction: Secondary | ICD-10-CM | POA: Insufficient documentation

## 2014-03-22 DIAGNOSIS — R05 Cough: Secondary | ICD-10-CM | POA: Insufficient documentation

## 2014-03-22 DIAGNOSIS — Z8711 Personal history of peptic ulcer disease: Secondary | ICD-10-CM | POA: Insufficient documentation

## 2014-03-22 DIAGNOSIS — Z79899 Other long term (current) drug therapy: Secondary | ICD-10-CM | POA: Insufficient documentation

## 2014-03-22 DIAGNOSIS — R059 Cough, unspecified: Secondary | ICD-10-CM

## 2014-03-22 DIAGNOSIS — Z720 Tobacco use: Secondary | ICD-10-CM | POA: Insufficient documentation

## 2014-03-22 DIAGNOSIS — Z951 Presence of aortocoronary bypass graft: Secondary | ICD-10-CM | POA: Insufficient documentation

## 2014-03-22 DIAGNOSIS — Z7982 Long term (current) use of aspirin: Secondary | ICD-10-CM | POA: Insufficient documentation

## 2014-03-22 DIAGNOSIS — I509 Heart failure, unspecified: Secondary | ICD-10-CM | POA: Insufficient documentation

## 2014-03-22 MED ORDER — ALBUTEROL SULFATE HFA 108 (90 BASE) MCG/ACT IN AERS: 2.0000 | INHALATION_SPRAY | RESPIRATORY_TRACT | Status: DC | PRN

## 2014-03-22 MED ORDER — ALBUTEROL SULFATE (2.5 MG/3ML) 0.083% IN NEBU
5.0000 mg | INHALATION_SOLUTION | Freq: Once | RESPIRATORY_TRACT | Status: AC
  Administered 2014-03-22: 5 mg via RESPIRATORY_TRACT
  Filled 2014-03-22: qty 6

## 2014-03-22 MED ORDER — AZITHROMYCIN 250 MG PO TABS: ORAL_TABLET | ORAL | Status: DC

## 2014-03-22 MED ORDER — GUAIFENESIN 100 MG/5ML PO SOLN
10.0000 mL | Freq: Once | ORAL | Status: AC
  Administered 2014-03-22: 200 mg via ORAL
  Filled 2014-03-22 (×2): qty 10

## 2014-03-22 MED ORDER — IPRATROPIUM BROMIDE 0.02 % IN SOLN
0.5000 mg | Freq: Once | RESPIRATORY_TRACT | Status: AC
  Administered 2014-03-22: 0.5 mg via RESPIRATORY_TRACT
  Filled 2014-03-22: qty 2.5

## 2014-03-22 NOTE — ED Notes (Signed)
Steinl at beside

## 2014-03-22 NOTE — Discharge Instructions (Signed)
It was our pleasure to provide your ER care today - we hope that you feel better.  Rest. Drink plenty of fluids. Take zithromax (antibiotic) as prescribed. Take mucinex or robitussin as need for cough. Use albuterol inhaler as need if wheezing. Avoid smoking. Follow up with primary care doctor in 1 week if symptoms fail to improve/resolve.  Return to ER if worse, new symptoms, trouble breathing, other concern.    Cough, Adult  A cough is a reflex that helps clear your throat and airways. It can help heal the body or may be a reaction to an irritated airway. A cough may only last 2 or 3 weeks (acute) or may last more than 8 weeks (chronic).  CAUSES Acute cough:  Viral or bacterial infections. Chronic cough:  Infections.  Allergies.  Asthma.  Post-nasal drip.  Smoking.  Heartburn or acid reflux.  Some medicines.  Chronic lung problems (COPD).  Cancer. SYMPTOMS   Cough.  Fever.  Chest pain.  Increased breathing rate.  High-pitched whistling sound when breathing (wheezing).  Colored mucus that you cough up (sputum). TREATMENT   A bacterial cough may be treated with antibiotic medicine.  A viral cough must run its course and will not respond to antibiotics.  Your caregiver may recommend other treatments if you have a chronic cough. HOME CARE INSTRUCTIONS   Only take over-the-counter or prescription medicines for pain, discomfort, or fever as directed by your caregiver. Use cough suppressants only as directed by your caregiver.  Use a cold steam vaporizer or humidifier in your bedroom or home to help loosen secretions.  Sleep in a semi-upright position if your cough is worse at night.  Rest as needed.  Stop smoking if you smoke. SEEK IMMEDIATE MEDICAL CARE IF:   You have pus in your sputum.  Your cough starts to worsen.  You cannot control your cough with suppressants and are losing sleep.  You begin coughing up blood.  You have difficulty  breathing.  You develop pain which is getting worse or is uncontrolled with medicine.  You have a fever. MAKE SURE YOU:   Understand these instructions.  Will watch your condition.  Will get help right away if you are not doing well or get worse. Document Released: 11/18/2010 Document Revised: 08/14/2011 Document Reviewed: 11/18/2010 [facility] Patient Information 2015 [place], [place]. This information is not intended to replace advice given to you by your health care provider. Make sure you discuss any questions you have with your health care provider.     Acute Bronchitis Bronchitis is inflammation of the airways that extend from the windpipe into the lungs (bronchi). The inflammation often causes mucus to develop. This leads to a cough, which is the most common symptom of bronchitis.  In acute bronchitis, the condition usually develops suddenly and goes away over time, usually in a couple weeks. Smoking, allergies, and asthma can make bronchitis worse. Repeated episodes of bronchitis may cause further lung problems.  CAUSES Acute bronchitis is most often caused by the same virus that causes a cold. The virus can spread from person to person (contagious) through coughing, sneezing, and touching contaminated objects. SIGNS AND SYMPTOMS   Cough.   Fever.   Coughing up mucus.   Body aches.   Chest congestion.   Chills.   Shortness of breath.   Sore throat.  DIAGNOSIS  Acute bronchitis is usually diagnosed through a physical exam. Your health care provider will also ask you questions about your medical history. Tests, such as  chest X-rays, are sometimes done to rule out other conditions.  TREATMENT  Acute bronchitis usually goes away in a couple weeks. Oftentimes, no medical treatment is necessary. Medicines are sometimes given for relief of fever or cough. Antibiotic medicines are usually not needed but may be prescribed in certain situations. In some cases, an inhaler  may be recommended to help reduce shortness of breath and control the cough. A cool mist vaporizer may also be used to help thin bronchial secretions and make it easier to clear the chest.  HOME CARE INSTRUCTIONS  Get plenty of rest.   Drink enough fluids to keep your urine clear or pale yellow (unless you have a medical condition that requires fluid restriction). Increasing fluids may help thin your respiratory secretions (sputum) and reduce chest congestion, and it will prevent dehydration.   Take medicines only as directed by your health care provider.  If you were prescribed an antibiotic medicine, finish it all even if you start to feel better.  Avoid smoking and secondhand smoke. Exposure to cigarette smoke or irritating chemicals will make bronchitis worse. If you are a smoker, consider using nicotine gum or skin patches to help control withdrawal symptoms. Quitting smoking will help your lungs heal faster.   Reduce the chances of another bout of acute bronchitis by washing your hands frequently, avoiding people with cold symptoms, and trying not to touch your hands to your mouth, nose, or eyes.   Keep all follow-up visits as directed by your health care provider.  SEEK MEDICAL CARE IF: Your symptoms do not improve after 1 week of treatment.  SEEK IMMEDIATE MEDICAL CARE IF:  You develop an increased fever or chills.   You have chest pain.   You have severe shortness of breath.  You have bloody sputum.   You develop dehydration.  You faint or repeatedly feel like you are going to pass out.  You develop repeated vomiting.  You develop a severe headache. MAKE SURE YOU:   Understand these instructions.  Will watch your condition.  Will get help right away if you are not doing well or get worse. Document Released: 06/29/2004 Document Revised: 10/06/2013 Document Reviewed: 11/12/2012 [facility] Patient Information 2015 [place], [place]. This information is not  intended to replace advice given to you by your health care provider. Make sure you discuss any questions you have with your health care provider.  Acute Bronchitis Bronchitis is inflammation of the airways that extend from the windpipe into the lungs (bronchi). The inflammation often causes mucus to develop. This leads to a cough, which is the most common symptom of bronchitis.  In acute bronchitis, the condition usually develops suddenly and goes away over time, usually in a couple weeks. Smoking, allergies, and asthma can make bronchitis worse. Repeated episodes of bronchitis may cause further lung problems.  CAUSES Acute bronchitis is most often caused by the same virus that causes a cold. The virus can spread from person to person (contagious) through coughing, sneezing, and touching contaminated objects. SIGNS AND SYMPTOMS   Cough.   Fever.   Coughing up mucus.   Body aches.   Chest congestion.   Chills.   Shortness of breath.   Sore throat.  DIAGNOSIS  Acute bronchitis is usually diagnosed through a physical exam. Your health care provider will also ask you questions about your medical history. Tests, such as chest X-rays, are sometimes done to rule out other conditions.  TREATMENT  Acute bronchitis usually goes away in a couple weeks.  Oftentimes, no medical treatment is necessary. Medicines are sometimes given for relief of fever or cough. Antibiotic medicines are usually not needed but may be prescribed in certain situations. In some cases, an inhaler may be recommended to help reduce shortness of breath and control the cough. A cool mist vaporizer may also be used to help thin bronchial secretions and make it easier to clear the chest.  HOME CARE INSTRUCTIONS  Get plenty of rest.   Drink enough fluids to keep your urine clear or pale yellow (unless you have a medical condition that requires fluid restriction). Increasing fluids may help thin your respiratory  secretions (sputum) and reduce chest congestion, and it will prevent dehydration.   Take medicines only as directed by your health care provider.  If you were prescribed an antibiotic medicine, finish it all even if you start to feel better.  Avoid smoking and secondhand smoke. Exposure to cigarette smoke or irritating chemicals will make bronchitis worse. If you are a smoker, consider using nicotine gum or skin patches to help control withdrawal symptoms. Quitting smoking will help your lungs heal faster.   Reduce the chances of another bout of acute bronchitis by washing your hands frequently, avoiding people with cold symptoms, and trying not to touch your hands to your mouth, nose, or eyes.   Keep all follow-up visits as directed by your health care provider.  SEEK MEDICAL CARE IF: Your symptoms do not improve after 1 week of treatment.  SEEK IMMEDIATE MEDICAL CARE IF:  You develop an increased fever or chills.   You have chest pain.   You have severe shortness of breath.  You have bloody sputum.   You develop dehydration.  You faint or repeatedly feel like you are going to pass out.  You develop repeated vomiting.  You develop a severe headache. MAKE SURE YOU:   Understand these instructions.  Will watch your condition.  Will get help right away if you are not doing well or get worse. Document Released: 06/29/2004 Document Revised: 10/06/2013 Document Reviewed: 11/12/2012 [facility] Patient Information 2015 [place]. This information is not intended to replace advice given to you by your health care provider. Make sure you discuss any questions you have with your health care provider.   Smoking Hazards Smoking cigarettes is extremely bad for your health. Tobacco smoke has over 200 known poisons in it. It contains the poisonous gases nitrogen oxide and carbon monoxide. There are over 60 chemicals in tobacco smoke that cause cancer. Some of the chemicals found  in cigarette smoke include:  Cyanide.  Benzene.  Formaldehyde.  Methanol (wood alcohol).  Acetylene (fuel used in welding torches).  Ammonia.  Even smoking lightly shortens your life expectancy by several years. You can greatly reduce the risk of medical problems for you and your family by stopping now. Smoking is the most preventable cause of death and disease in our society. Within days of quitting smoking, your circulation improves, you decrease the risk of having a heart attack, and your lung capacity improves. There may be some increased phlegm in the first few days after quitting, and it may take months for your lungs to clear up completely. Quitting for 10 years reduces your risk of developing lung cancer to almost that of a nonsmoker.  WHAT ARE THE RISKS OF SMOKING? Cigarette smokers have an increased risk of many serious medical problems, including: Lung cancer.  Lung disease (such as pneumonia, bronchitis, and emphysema).  Heart attack and chest pain due  to the heart not getting enough oxygen (angina).  Heart disease and peripheral blood vessel disease.  Hypertension.  Stroke.  Oral cancer (cancer of the lip, mouth, or voice box).  Bladder cancer.  Pancreatic cancer.  Cervical cancer.  Pregnancy complications, including premature birth.  Stillbirths and smaller newborn babies, birth defects, and genetic damage to sperm.  Early menopause.  Lower estrogen level for women.  Infertility.  Facial wrinkles.  Blindness.  Increased risk of broken bones (fractures).  Senile dementia.  Stomach ulcers and internal bleeding.  Delayed wound healing and increased risk of complications during surgery. Because of secondhand smoke exposure, children of smokers have an increased risk of the following:  Sudden infant death syndrome (SIDS).  Respiratory infections.  Lung cancer.  Heart disease.  Ear infections.  WHY IS SMOKING ADDICTIVE? Nicotine is the  chemical agent in tobacco that is capable of causing addiction or dependence. When you smoke and inhale, nicotine is absorbed rapidly into the bloodstream through your lungs. Both inhaled and noninhaled nicotine may be addictive.  WHAT ARE THE BENEFITS OF QUITTING?  There are many health benefits to quitting smoking. Some are:  The likelihood of developing cancer and heart disease decreases. Health improvements are seen almost immediately.  Blood pressure, pulse rate, and breathing patterns start returning to normal soon after quitting.  People who quit may see an improvement in their overall quality of life.  HOW DO YOU QUIT SMOKING? Smoking is an addiction with both physical and psychological effects, and longtime habits can be hard to change. Your health care provider can recommend: Programs and community resources, which may include group support, education, or therapy. Replacement products, such as patches, gum, and nasal sprays. Use these products only as directed. Do not replace cigarette smoking with electronic cigarettes (commonly called e-cigarettes). The safety of e-cigarettes is unknown, and some may contain harmful chemicals. FOR MORE INFORMATION American Lung Association: www.lung.org American Cancer Society: www.cancer.org Document Released: 06/29/2004 Document Revised: 03/12/2013 Document Reviewed: 11/11/2012 [facility] Patient Information 2015 [place], [place]. This information is not intended to replace advice given to you by your health care provider. Make sure you discuss any questions you have with your health care provider.

## 2014-03-22 NOTE — ED Notes (Signed)
Pt from home reports non-productive cough x3 weeks. Pt sts that he had heart surgery 3 months ago. Pt denies SOB, CP, fever. Pt is A&O and in NAD

## 2014-03-22 NOTE — ED Provider Notes (Signed)
CSN: [id number]     Arrival date & time 03/22/14  0759 History   First MD Initiated Contact with Patient 03/22/14 0827     Chief Complaint  Patient presents with  . Cough  . URI     (Consider location/radiation/quality/duration/timing/severity/associated sxs/prior Treatment) Patient is a 53 y.o. male presenting with cough and URI. The history is provided by the patient.  Cough Associated symptoms: no chest pain, no chills, no eye discharge, no fever, no headaches, no rash, no shortness of breath and no sore throat   URI Presenting symptoms: congestion and cough   Presenting symptoms: no fever and no sore throat   Associated symptoms: no headaches and no neck pain   pt c/o non productive cough, nasal congestion, for the past 1-2 weeks. Denies hx asthma or copd, although noted to have hx copd in chart. Pt is smoker, and states occasional mdi use in past w uri symptoms. No chest pain or discomfort. No sob. No sore throat. No known ill contacts. Denies swelling, no orthopnea or pnd. No rash.  No acute or abrupt worsening of symptoms today, persistent.     Past Medical History  Diagnosis Date  . MI (myocardial infarction)   . CAD (coronary artery disease), native coronary artery   . ST elevation (STEMI) myocardial infarction involving left anterior descending coronary artery 12/18/2013    Cath: early mLAD 100% after mod D1, mRCA 100% (L-R collaterals), Om1 80% ostial --> PTCA of LAD --> CABG x 5 ; Echo: EF 35-40% - hypokinesis of inferior, inferoseptal and distal posterior walls; apical dyskinesis  . 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 Laneta Simmers): LIMA-LAD, SVG-rAM, SeqSVG-RPDA-PL, SVG-OM  . Heavy smoker     3PPD  . Essential hypertension   . Dyslipidemia, goal LDL below 70   . CHF (congestive heart failure)   . Pneumonia   . PUD (peptic ulcer disease)   . COPD (chronic obstructive pulmonary disease)   . Hx of echocardiogram     Echo (10/15): EF 55-60%, normal wall motion, normal diastolic  function, trivial AI, mild LAE   Past Surgical History  Procedure Laterality Date  . Coronary angioplasty with stent placement    . Fracture surgery    . Coronary artery bypass graft N/A 12/18/2013    Procedure: CORONARY ARTERY BYPASS GRAFTING (CABG);  Surgeon: Alleen Borne, MD;  Location: [facility] OR;  Service: Open Heart Surgery;  Laterality: N/A;  . Intraoperative transesophageal echocardiogram N/A 12/18/2013    Procedure: INTRAOPERATIVE TRANSESOPHAGEAL ECHOCARDIOGRAM;  Surgeon: Alleen Borne, MD;  Location: [facility] OR;  Service: Open Heart Surgery;  Laterality: N/A;   Family History  Problem Relation Age of Onset  . Cancer Other   . CAD Other   . Cancer Father   . Hypertension Father   . Stroke Father   . Heart disease Mother   . Heart attack    . Heart attack Neg Hx    History  Substance Use Topics  . Smoking status: Current Every Day Smoker -- 3.00 packs/day for 20 years    Types: Cigarettes  . Smokeless tobacco: Not on file     Comment: states he quit smoking 12/17/13 but was seen smoking outside clinic  . Alcohol Use: No    Review of Systems  Constitutional: Negative for fever and chills.  HENT: Positive for congestion. Negative for sore throat.   Eyes: Negative for discharge and redness.  Respiratory: Positive for cough. Negative for shortness of breath.  Cardiovascular: Negative for chest pain and leg swelling.  Gastrointestinal: Negative for vomiting, abdominal pain and diarrhea.  Genitourinary: Negative for dysuria and flank pain.  Musculoskeletal: Negative for back pain and neck pain.  Skin: Negative for rash.  Neurological: Negative for headaches.  Hematological: Does not bruise/bleed easily.  Psychiatric/Behavioral: Negative for confusion.      Allergies  Review of patient's allergies indicates no known allergies.  Home Medications   Prior to Admission medications   Medication Sig Start Date End Date Taking? Authorizing Provider  aspirin EC 325 MG tablet  Take 325 mg by mouth daily.   Yes Historical Provider, MD  atorvastatin (LIPITOR) 80 MG tablet Take 1 tablet (80 mg total) by mouth daily. 03/02/14  Yes Scott Moishe Spice Weaver, PA-C  lisinopril (PRINIVIL,ZESTRIL) 2.5 MG tablet Take 2.5 mg by mouth daily. 02/04/14  Yes Beatrice LecherScott T Weaver, PA-C  metoprolol tartrate (LOPRESSOR) 25 MG tablet Take 37.5 mg by mouth 2 (two) times daily.   Yes Historical Provider, MD  Pseudoephedrine-APAP-DM (DAYQUIL PO) Take 15-30 mLs by mouth as needed (for cold symptoms).   Yes Historical Provider, MD   BP 121/83  Pulse 89  Resp 18  SpO2 99% Physical Exam  Nursing note and vitals reviewed. Constitutional: He is oriented to person, place, and time. He appears well-developed and well-nourished. No distress.  HENT:  Mouth/Throat: Oropharynx is clear and moist.  Mild nasal congestion  Eyes: Pupils are equal, round, and reactive to light.  Neck: Neck supple. No tracheal deviation present.  No stiffness or rigidity  Cardiovascular: Normal rate, regular rhythm, normal heart sounds and intact distal pulses.  Exam reveals no gallop and no friction rub.   No murmur heard. Pulmonary/Chest: Effort normal. No accessory muscle usage. No respiratory distress. He has wheezes.  Mild exp wheezing. Chest congestion.  Abdominal: Soft. Bowel sounds are normal. He exhibits no distension. There is no tenderness.  Musculoskeletal: Normal range of motion. He exhibits no edema and no tenderness.  Neurological: He is alert and oriented to person, place, and time.  Skin: Skin is warm and dry. No rash noted. He is not diaphoretic.  Psychiatric: He has a normal mood and affect.    ED Course  Procedures (including critical care time) Labs Review  Dg Chest 2 View  03/22/2014   CLINICAL DATA:  Cough, congestion and shortness of breath 3 weeks. Chest pain.  EXAM: CHEST  2 VIEW  COMPARISON:  01/28/2014  FINDINGS: Sternotomy wires unchanged. Lungs are adequately inflated without consolidation or  effusion. There has been interval resolution of the small left pleural effusion. Cardiomediastinal silhouette and remainder of the exam is unchanged.  IMPRESSION: No acute findings. Interval resolution of the previously noted small left pleural effusion.   Electronically Signed   By: Elberta Fortisaniel  Boyle M.D.   On: 03/22/2014 10:13     MDM  Cxr. Albuterol and atrovent neb.  Robitussin for non prod cough.  Reviewed nursing notes and prior charts for additional history.   Pt smoker, worsening cough x 1-2 weeks, wheezing, congestion/rhonchi - will rx for bronchitis, possible early/dev pna, and given mdi.  Recheck wheezing resolved. Pt feels improved.    Suzi RootsKevin E Yanira Tolsma, MD 03/22/14 [phone]

## 2014-03-22 NOTE — ED Notes (Signed)
Pt reports cold/ cough x3 weeks. Denies pain. Reports non productive cough and runny nose. Pt vomited last night.

## 2014-03-22 NOTE — ED Notes (Signed)
Patient to xray.

## 2014-03-23 ENCOUNTER — Other Ambulatory Visit: Payer: Self-pay

## 2014-03-23 ENCOUNTER — Ambulatory Visit (INDEPENDENT_AMBULATORY_CARE_PROVIDER_SITE_OTHER): Payer: Self-pay | Admitting: Cardiology

## 2014-03-23 ENCOUNTER — Encounter: Payer: Self-pay | Admitting: Cardiology

## 2014-03-23 VITALS — BP 102/78 | HR 87 | Ht 72.0 in | Wt 222.0 lb

## 2014-03-23 DIAGNOSIS — Z951 Presence of aortocoronary bypass graft: Secondary | ICD-10-CM

## 2014-03-23 DIAGNOSIS — F17200 Nicotine dependence, unspecified, uncomplicated: Secondary | ICD-10-CM

## 2014-03-23 DIAGNOSIS — I2510 Atherosclerotic heart disease of native coronary artery without angina pectoris: Secondary | ICD-10-CM

## 2014-03-23 DIAGNOSIS — I10 Essential (primary) hypertension: Secondary | ICD-10-CM

## 2014-03-23 DIAGNOSIS — E785 Hyperlipidemia, unspecified: Secondary | ICD-10-CM

## 2014-03-23 DIAGNOSIS — I255 Ischemic cardiomyopathy: Secondary | ICD-10-CM | POA: Insufficient documentation

## 2014-03-23 MED ORDER — ASPIRIN EC 81 MG PO TBEC: 81.0000 mg | DELAYED_RELEASE_TABLET | Freq: Every day | ORAL | Status: DC

## 2014-03-23 NOTE — Telephone Encounter (Signed)
pt notified about echo results with verbla understanding

## 2014-03-23 NOTE — Patient Instructions (Signed)
Your physician has recommended you make the following change in your medication:  DECREASE Aspirin to 81 mg once daily  Your physician wants you to follow-up in: 6 months with Dr. Mayford Knife.  You will receive a reminder letter in the mail two months in advance. If you don't receive a letter, please call our office to schedule the follow-up appointment.

## 2014-03-23 NOTE — Progress Notes (Signed)
[address] [place], [place]  [address] Phone: [phone] Fax:  [phone]  Date:  [address]0/19/2015   ID:  Lee MinkWilliam D May, DOB 10/17/1960, MRN [id number]  PCP:  Doris CheadleADVANI, DEEPAK, MD  Cardiologist:  Armanda Magicraci Turner, MD    History of Present Illness: Lee May is a 53 y.o. male with a hx of CAD s/p remote MI with PCI in [facility] 2009, HTN, HL. He was admitted 12/2013 with an ant-lat STEMI. Emergent cardiac catheterization demonstrated 3 vessel CAD with acute occlusion of the LAD. LAD was treated with POBA. Echo demonstrated EF of 35-40%. He underwent CABG with Dr. Laneta SimmersBartle on 12/19/13 (LIMA-LAD, SVG-OM1, SVG-AM, SVG-PDA/PL). He was readmitted 7/30-8/1 with acute pyloric channel ulcer. He was treated with Protonix.  Since last seen by my PA, he went to the ED and was dx with CAP >>> tx with doxycycline. He then he developed bronchitis and was placed back on antibiotics.  He returns for FU.  He has had some SOB with the bronchitis.  The patient denies chest pain, dizzines, palpitations or syncope, orthopnea, PND or significant pedal edema.    Wt Readings from Last 3 Encounters:  03/23/14 222 lb (100.699 kg)  02/04/14 223 lb (101.152 kg)  01/28/14 220 lb (99.791 kg)     Past Medical History  Diagnosis Date  . MI (myocardial infarction)   . CAD (coronary artery disease), native coronary artery   . ST elevation (STEMI) myocardial infarction involving left anterior descending coronary artery 12/18/2013    Cath: early mLAD 100% after mod D1, mRCA 100% (L-R collaterals), Om1 80% ostial --> PTCA of LAD --> CABG x 5 ; Echo: EF 35-40% - hypokinesis of inferior, inferoseptal and distal posterior walls; apical dyskinesis  . 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 Laneta SimmersBartle): LIMA-LAD, SVG-rAM, SeqSVG-RPDA-PL, SVG-OM  . Heavy smoker     3PPD  . Essential hypertension   . Dyslipidemia, goal LDL below 70   . Pneumonia   . PUD (peptic ulcer disease)   . COPD (chronic obstructive pulmonary disease)   .  Cardiomyopathy, ischemic     resolved and EF now 55-60% by echo 03/2014    Current Outpatient Prescriptions  Medication Sig Dispense Refill  . albuterol (PROVENTIL HFA;VENTOLIN HFA) 108 (90 BASE) MCG/ACT inhaler Inhale 2 puffs into the lungs every 4 (four) hours as needed for wheezing or shortness of breath.  1 Inhaler  1  . aspirin EC 325 MG tablet Take 325 mg by mouth daily.      Marland Kitchen. atorvastatin (LIPITOR) 80 MG tablet Take 1 tablet (80 mg total) by mouth daily.  30 tablet  3  . azithromycin (ZITHROMAX Z-PAK) 250 MG tablet Take as directed  6 tablet  0  . lisinopril (PRINIVIL,ZESTRIL) 2.5 MG tablet Take 2.5 mg by mouth daily.      . metoprolol tartrate (LOPRESSOR) 25 MG tablet Take 37.5 mg by mouth 2 (two) times daily.      . Pseudoephedrine-APAP-DM (DAYQUIL PO) Take 15-30 mLs by mouth as needed (for cold symptoms).       No current facility-administered medications for this visit.    Allergies:   No Known Allergies  Social History:  The patient  reports that he has been smoking Cigarettes.  He has a 60 pack-year smoking history. He does not have any smokeless tobacco history on file. He reports that he does not drink alcohol or use illicit drugs.   Family History:  The patient's family  history includes CAD in his other; Cancer in his father and other; Heart attack in an other family member; Heart disease in his mother; Hypertension in his father; Stroke in his father. There is no history of Heart attack.   ROS:  Please see the history of present illness.      All other systems reviewed and negative.   PHYSICAL EXAM: VS:  BP 102/78  Pulse 87  Ht 6' (1.829 m)  Wt 222 lb (100.699 kg)  BMI 30.10 kg/m2 Well nourished, well developed, in no acute distress HEENT: normal Neck: no JVD Cardiac:  normal S1, S2; RRR; no murmuroccasional ectopy Lungs:  Diffuse expiratory wheezes Abd: soft, nontender, no hepatomegaly Ext: no edema Skin: warm and dry Neuro:  CNs 2-12 intact, no focal  abnormalities noted  EKG:  NSR with no ST changes     ASSESSMENT AND PLAN:  1. Atherosclerosis of native coronary artery of native heart without angina pectoris: Doing well. No angina. Continue aspirin, statin, beta blocker. - decrease ASA to 81mg  daily 2. Ischemic Cardiomyopathy: Continue beta blocker/ACE I. Repeat echo showed normal LVF with EF 55-60% with no wall motion abnormality.  No ICD indicated. 3. Essential hypertension: Controlled. Continue BB and ACE 4. Dyslipidemia: Continue statin. Lipitor increased last month to 80mg  daily and will recheck 04/2014 5. PUD (peptic ulcer disease): Improved. FU with GI as planned.   Followup with me in 6 months  Signed, Armanda Magic, MD [facility] 03/23/2014 11:03 AM

## 2014-03-24 ENCOUNTER — Ambulatory Visit: Payer: Self-pay | Admitting: Internal Medicine

## 2014-03-24 ENCOUNTER — Encounter: Payer: Self-pay | Admitting: Internal Medicine

## 2014-03-24 NOTE — Progress Notes (Signed)
Patient ID: Lee May, male   DOB: [birth year], 53 y.o.   MRN: [id number] Called and spoke with patient.  He thought we had changed his appointment to another day.  I checked the system, it was another office.  He currently has bronchitis and wants to get better before r/s'ing.  I gave him our phone # to call back and told him Dr. Leone Payor wants to see him this year.  He verbalized understanding.

## 2014-03-27 ENCOUNTER — Ambulatory Visit: Payer: Self-pay | Admitting: Internal Medicine

## 2014-03-30 ENCOUNTER — Ambulatory Visit: Payer: Self-pay | Admitting: Internal Medicine

## 2014-04-27 ENCOUNTER — Other Ambulatory Visit: Payer: Self-pay

## 2014-05-14 ENCOUNTER — Encounter (HOSPITAL_COMMUNITY): Payer: Self-pay | Admitting: Cardiology

## 2014-07-03 ENCOUNTER — Encounter: Payer: Self-pay | Admitting: Cardiology

## 2015-08-11 ENCOUNTER — Other Ambulatory Visit
Admission: RE | Admit: 2015-08-11 | Discharge: 2015-08-11 | Disposition: A | Discharge status: Home / Self Care | Payer: Commercial Managed Care - POS | Source: Ambulatory Visit | Attending: Gastroenterology | Admitting: Gastroenterology

## 2015-08-11 ENCOUNTER — Ambulatory Visit: Payer: Self-pay

## 2015-08-11 DIAGNOSIS — K5730 Diverticulosis of large intestine without perforation or abscess without bleeding: Secondary | ICD-10-CM | POA: Insufficient documentation

## 2015-08-11 DIAGNOSIS — K649 Unspecified hemorrhoids: Secondary | ICD-10-CM | POA: Insufficient documentation

## 2015-08-11 DIAGNOSIS — Z1211 Encounter for screening for malignant neoplasm of colon: Secondary | ICD-10-CM | POA: Insufficient documentation

## 2015-08-11 DIAGNOSIS — D125 Benign neoplasm of sigmoid colon: Secondary | ICD-10-CM | POA: Insufficient documentation

## 2015-08-11 DIAGNOSIS — K635 Polyp of colon: Secondary | ICD-10-CM | POA: Insufficient documentation

## 2015-08-12 LAB — LAB USE ONLY - HISTORICAL SURGICAL PATHOLOGY

## 2015-09-28 ENCOUNTER — Encounter (HOSPITAL_COMMUNITY): Payer: Self-pay

## 2015-09-28 ENCOUNTER — Observation Stay (HOSPITAL_COMMUNITY)
Admission: EM | Admit: 2015-09-28 | Discharge: 2015-10-01 | Disposition: A | Discharge status: Home / Self Care | Payer: Self-pay | Attending: Internal Medicine | Admitting: Internal Medicine

## 2015-09-28 DIAGNOSIS — F17210 Nicotine dependence, cigarettes, uncomplicated: Secondary | ICD-10-CM | POA: Insufficient documentation

## 2015-09-28 DIAGNOSIS — Z7982 Long term (current) use of aspirin: Secondary | ICD-10-CM | POA: Insufficient documentation

## 2015-09-28 DIAGNOSIS — I2582 Chronic total occlusion of coronary artery: Secondary | ICD-10-CM | POA: Insufficient documentation

## 2015-09-28 DIAGNOSIS — E785 Hyperlipidemia, unspecified: Secondary | ICD-10-CM | POA: Diagnosis present

## 2015-09-28 DIAGNOSIS — I25710 Atherosclerosis of autologous vein coronary artery bypass graft(s) with unstable angina pectoris: Secondary | ICD-10-CM | POA: Insufficient documentation

## 2015-09-28 DIAGNOSIS — I255 Ischemic cardiomyopathy: Secondary | ICD-10-CM | POA: Diagnosis present

## 2015-09-28 DIAGNOSIS — L0390 Cellulitis, unspecified: Secondary | ICD-10-CM | POA: Diagnosis present

## 2015-09-28 DIAGNOSIS — R9439 Abnormal result of other cardiovascular function study: Secondary | ICD-10-CM | POA: Clinically undetermined

## 2015-09-28 DIAGNOSIS — K279 Peptic ulcer, site unspecified, unspecified as acute or chronic, without hemorrhage or perforation: Secondary | ICD-10-CM | POA: Diagnosis present

## 2015-09-28 DIAGNOSIS — Z951 Presence of aortocoronary bypass graft: Secondary | ICD-10-CM

## 2015-09-28 DIAGNOSIS — Z8249 Family history of ischemic heart disease and other diseases of the circulatory system: Secondary | ICD-10-CM | POA: Insufficient documentation

## 2015-09-28 DIAGNOSIS — J449 Chronic obstructive pulmonary disease, unspecified: Secondary | ICD-10-CM | POA: Insufficient documentation

## 2015-09-28 DIAGNOSIS — Z955 Presence of coronary angioplasty implant and graft: Secondary | ICD-10-CM | POA: Insufficient documentation

## 2015-09-28 DIAGNOSIS — I25110 Atherosclerotic heart disease of native coronary artery with unstable angina pectoris: Principal | ICD-10-CM | POA: Diagnosis present

## 2015-09-28 DIAGNOSIS — Z9114 Patient's other noncompliance with medication regimen: Secondary | ICD-10-CM | POA: Insufficient documentation

## 2015-09-28 DIAGNOSIS — I252 Old myocardial infarction: Secondary | ICD-10-CM | POA: Insufficient documentation

## 2015-09-28 DIAGNOSIS — F17200 Nicotine dependence, unspecified, uncomplicated: Secondary | ICD-10-CM | POA: Diagnosis present

## 2015-09-28 DIAGNOSIS — R079 Chest pain, unspecified: Secondary | ICD-10-CM | POA: Diagnosis present

## 2015-09-28 DIAGNOSIS — I10 Essential (primary) hypertension: Secondary | ICD-10-CM | POA: Diagnosis present

## 2015-09-28 DIAGNOSIS — R7303 Prediabetes: Secondary | ICD-10-CM | POA: Insufficient documentation

## 2015-09-28 DIAGNOSIS — L03111 Cellulitis of right axilla: Secondary | ICD-10-CM | POA: Insufficient documentation

## 2015-09-28 DIAGNOSIS — B354 Tinea corporis: Secondary | ICD-10-CM | POA: Insufficient documentation

## 2015-09-28 NOTE — ED Notes (Signed)
Per EMS pt states he was cooking tonight and had sudden onset of SOB; Pt felt he could not catch his breath; Pt has cardiac hx of open heart surgery x 2 years ago and has had stent placed in the past; pt states he is suppose to take meds but currently non compliant with meds; Pt is on 2L of O2; Pt c/o tiredness from SOB; Pt c/o chest pain during SOB epsiode; EMS states distended abdomen however pt denies any liver issues; EMS states pt appears to have rash "ring worm" on arrival to left arm;

## 2015-09-29 ENCOUNTER — Observation Stay (HOSPITAL_COMMUNITY): Payer: Self-pay

## 2015-09-29 ENCOUNTER — Observation Stay (HOSPITAL_BASED_OUTPATIENT_CLINIC_OR_DEPARTMENT_OTHER): Payer: Self-pay

## 2015-09-29 ENCOUNTER — Emergency Department (HOSPITAL_COMMUNITY): Payer: Self-pay

## 2015-09-29 ENCOUNTER — Encounter (HOSPITAL_COMMUNITY): Payer: Self-pay | Admitting: *Deleted

## 2015-09-29 DIAGNOSIS — Z951 Presence of aortocoronary bypass graft: Secondary | ICD-10-CM

## 2015-09-29 DIAGNOSIS — F17200 Nicotine dependence, unspecified, uncomplicated: Secondary | ICD-10-CM

## 2015-09-29 DIAGNOSIS — I10 Essential (primary) hypertension: Secondary | ICD-10-CM

## 2015-09-29 DIAGNOSIS — I209 Angina pectoris, unspecified: Secondary | ICD-10-CM

## 2015-09-29 DIAGNOSIS — L0390 Cellulitis, unspecified: Secondary | ICD-10-CM | POA: Diagnosis present

## 2015-09-29 DIAGNOSIS — Z9119 Patient's noncompliance with other medical treatment and regimen: Secondary | ICD-10-CM

## 2015-09-29 DIAGNOSIS — R079 Chest pain, unspecified: Secondary | ICD-10-CM

## 2015-09-29 DIAGNOSIS — I25110 Atherosclerotic heart disease of native coronary artery with unstable angina pectoris: Principal | ICD-10-CM | POA: Diagnosis present

## 2015-09-29 DIAGNOSIS — Z720 Tobacco use: Secondary | ICD-10-CM

## 2015-09-29 DIAGNOSIS — E785 Hyperlipidemia, unspecified: Secondary | ICD-10-CM

## 2015-09-29 DIAGNOSIS — I255 Ischemic cardiomyopathy: Secondary | ICD-10-CM

## 2015-09-29 LAB — CBC WITH DIFFERENTIAL/PLATELET
Basophils Absolute: 0 10*3/uL (ref 0.0–0.1)
Basophils Relative: 0 %
Eosinophils Absolute: 0.2 10*3/uL (ref 0.0–0.7)
Eosinophils Relative: 1 %
HCT: 47.6 % (ref 39.0–52.0)
Hemoglobin: 16.2 g/dL (ref 13.0–17.0)
Lymphocytes Relative: 15 %
Lymphs Abs: 1.9 10*3/uL (ref 0.7–4.0)
MCH: 30.1 pg (ref 26.0–34.0)
MCHC: 34 g/dL (ref 30.0–36.0)
MCV: 88.3 fL (ref 78.0–100.0)
Monocytes Absolute: 0.9 10*3/uL (ref 0.1–1.0)
Monocytes Relative: 7 %
Neutro Abs: 9.4 10*3/uL — ABNORMAL HIGH (ref 1.7–7.7)
Neutrophils Relative %: 77 %
Platelets: 296 10*3/uL (ref 150–400)
RBC: 5.39 MIL/uL (ref 4.22–5.81)
RDW: 13.8 % (ref 11.5–15.5)
WBC: 12.4 10*3/uL — ABNORMAL HIGH (ref 4.0–10.5)

## 2015-09-29 LAB — CBC
HCT: 46.9 % (ref 39.0–52.0)
Hemoglobin: 15.7 g/dL (ref 13.0–17.0)
MCH: 30.1 pg (ref 26.0–34.0)
MCHC: 33.5 g/dL (ref 30.0–36.0)
MCV: 89.8 fL (ref 78.0–100.0)
PLATELETS: 271 10*3/uL (ref 150–400)
RBC: 5.22 MIL/uL (ref 4.22–5.81)
RDW: 14 % (ref 11.5–15.5)
WBC: 10.6 10*3/uL — AB (ref 4.0–10.5)

## 2015-09-29 LAB — I-STAT TROPONIN, ED: Troponin i, poc: 0.02 ng/mL (ref 0.00–0.08)

## 2015-09-29 LAB — NM MYOCAR MULTI W/SPECT W/WALL MOTION / EF
CHL CUP NUCLEAR SDS: 10
CHL CUP RESTING HR STRESS: 62 {beats}/min
CHL CUP STRESS STAGE 1 SPEED: 0 mph
CHL CUP STRESS STAGE 2 GRADE: 0 %
CHL CUP STRESS STAGE 2 SPEED: 0 mph
CHL CUP STRESS STAGE 3 DBP: 76 mmHg
CHL CUP STRESS STAGE 3 GRADE: 0 %
CHL CUP STRESS STAGE 4 DBP: 74 mmHg
CHL CUP STRESS STAGE 4 HR: 74 {beats}/min
CHL CUP STRESS STAGE 4 SBP: 108 mmHg
CSEPED: 0 min
CSEPEDS: 0 s
CSEPHR: 48 %
CSEPPMHR: 44 %
Estimated workload: 1 METS
LV dias vol: 173 mL (ref 62–150)
LVSYSVOL: 104 mL
MPHR: 168 {beats}/min
Peak BP: 108 mmHg
Peak HR: 74 {beats}/min
RATE: 0.29
SRS: 8
SSS: 13
Stage 1 DBP: 72 mmHg
Stage 1 Grade: 0 %
Stage 1 HR: 65 {beats}/min
Stage 1 SBP: 104 mmHg
Stage 2 HR: 65 {beats}/min
Stage 3 HR: 77 {beats}/min
Stage 3 SBP: 114 mmHg
Stage 3 Speed: 0 mph
Stage 4 Grade: 0 %
Stage 4 Speed: 0 mph
TID: 1.41

## 2015-09-29 LAB — CREATININE, SERUM
CREATININE: 0.48 mg/dL — AB (ref 0.61–1.24)
GFR calc non Af Amer: >60 mL/min (ref >60)

## 2015-09-29 LAB — LIPID PANEL
CHOL/HDL RATIO: 4.8 ratio
CHOLESTEROL: 221 mg/dL — AB (ref 0–200)
HDL: 46 mg/dL (ref >40)
LDL Cholesterol: 160 mg/dL — ABNORMAL HIGH (ref 0–99)
Triglycerides: 77 mg/dL (ref <150)
VLDL: 15 mg/dL (ref 0–40)

## 2015-09-29 LAB — BASIC METABOLIC PANEL
Anion gap: 8 (ref 5–15)
BUN: 17 mg/dL (ref 6–20)
CHLORIDE: 105 mmol/L (ref 101–111)
CO2: 25 mmol/L (ref 22–32)
Calcium: 8.9 mg/dL (ref 8.9–10.3)
Creatinine, Ser: 1.03 mg/dL (ref 0.61–1.24)
GFR calc non Af Amer: >60 mL/min (ref >60)
Glucose, Bld: 97 mg/dL (ref 65–99)
Potassium: 3.9 mmol/L (ref 3.5–5.1)
Sodium: 138 mmol/L (ref 135–145)

## 2015-09-29 LAB — TROPONIN I
TROPONIN I: 0.03 ng/mL (ref <0.031)
Troponin I: 0.03 ng/mL (ref <0.031)
Troponin I: 0.03 ng/mL (ref <0.031)

## 2015-09-29 LAB — BRAIN NATRIURETIC PEPTIDE: B NATRIURETIC PEPTIDE 5: 91.5 pg/mL (ref 0.0–100.0)

## 2015-09-29 MED ORDER — ACETAMINOPHEN 325 MG PO TABS
650.0000 mg | ORAL_TABLET | ORAL | Status: DC | PRN
  Administered 2015-09-30: 650 mg via ORAL
  Filled 2015-09-29: qty 2

## 2015-09-29 MED ORDER — SODIUM CHLORIDE 0.9% FLUSH
3.0000 mL | Freq: Two times a day (BID) | INTRAVENOUS | Status: DC
  Administered 2015-09-29: 3 mL via INTRAVENOUS

## 2015-09-29 MED ORDER — ASPIRIN 81 MG PO CHEW
324.0000 mg | CHEWABLE_TABLET | Freq: Once | ORAL | Status: AC
  Administered 2015-09-29: 324 mg via ORAL
  Filled 2015-09-29: qty 4

## 2015-09-29 MED ORDER — REGADENOSON 0.4 MG/5ML IV SOLN
INTRAVENOUS | Status: AC
  Administered 2015-09-29: 0.4 mg via INTRAVENOUS
  Filled 2015-09-29: qty 5

## 2015-09-29 MED ORDER — ASPIRIN EC 81 MG PO TBEC
81.0000 mg | DELAYED_RELEASE_TABLET | Freq: Every day | ORAL | Status: DC
  Administered 2015-09-29: 81 mg via ORAL
  Filled 2015-09-29: qty 1

## 2015-09-29 MED ORDER — NICOTINE 21 MG/24HR TD PT24
21.0000 mg | MEDICATED_PATCH | Freq: Every day | TRANSDERMAL | Status: DC
  Administered 2015-09-29 – 2015-10-01 (×3): 21 mg via TRANSDERMAL
  Filled 2015-09-29 (×3): qty 1

## 2015-09-29 MED ORDER — METOPROLOL TARTRATE 25 MG PO TABS
25.0000 mg | ORAL_TABLET | Freq: Two times a day (BID) | ORAL | Status: DC
  Administered 2015-09-29 – 2015-10-01 (×5): 25 mg via ORAL
  Filled 2015-09-29 (×6): qty 1

## 2015-09-29 MED ORDER — ATORVASTATIN CALCIUM 80 MG PO TABS
80.0000 mg | ORAL_TABLET | Freq: Every day | ORAL | Status: DC
  Administered 2015-09-29 – 2015-10-01 (×2): 80 mg via ORAL
  Filled 2015-09-29 (×2): qty 1

## 2015-09-29 MED ORDER — ASPIRIN 81 MG PO CHEW
81.0000 mg | CHEWABLE_TABLET | ORAL | Status: AC
  Administered 2015-09-30: 81 mg via ORAL
  Filled 2015-09-29: qty 1

## 2015-09-29 MED ORDER — TECHNETIUM TC 99M SESTAMIBI - CARDIOLITE
10.0000 | Freq: Once | INTRAVENOUS | Status: AC | PRN
  Administered 2015-09-29: 10 via INTRAVENOUS

## 2015-09-29 MED ORDER — IPRATROPIUM-ALBUTEROL 0.5-2.5 (3) MG/3ML IN SOLN
3.0000 mL | Freq: Three times a day (TID) | RESPIRATORY_TRACT | Status: DC
  Administered 2015-09-29 – 2015-10-01 (×6): 3 mL via RESPIRATORY_TRACT
  Filled 2015-09-29 (×6): qty 3

## 2015-09-29 MED ORDER — ENOXAPARIN SODIUM 40 MG/0.4ML ~~LOC~~ SOLN
40.0000 mg | SUBCUTANEOUS | Status: DC
  Administered 2015-09-29 – 2015-10-01 (×2): 40 mg via SUBCUTANEOUS
  Filled 2015-09-29 (×3): qty 0.4

## 2015-09-29 MED ORDER — ONDANSETRON HCL 4 MG/2ML IJ SOLN: 4.0000 mg | Freq: Four times a day (QID) | INTRAMUSCULAR | Status: DC | PRN

## 2015-09-29 MED ORDER — ASPIRIN EC 325 MG PO TBEC
325.0000 mg | DELAYED_RELEASE_TABLET | Freq: Every day | ORAL | Status: DC
  Administered 2015-09-30: 325 mg via ORAL
  Filled 2015-09-29: qty 1

## 2015-09-29 MED ORDER — IPRATROPIUM-ALBUTEROL 0.5-2.5 (3) MG/3ML IN SOLN
3.0000 mL | Freq: Four times a day (QID) | RESPIRATORY_TRACT | Status: DC
  Administered 2015-09-29: 3 mL via RESPIRATORY_TRACT
  Filled 2015-09-29 (×2): qty 3

## 2015-09-29 MED ORDER — TERBINAFINE HCL 1 % EX CREA
TOPICAL_CREAM | Freq: Two times a day (BID) | CUTANEOUS | Status: DC
  Administered 2015-09-29 – 2015-09-30 (×3): via TOPICAL
  Filled 2015-09-29: qty 12

## 2015-09-29 MED ORDER — SULFAMETHOXAZOLE-TRIMETHOPRIM 800-160 MG PO TABS
2.0000 | ORAL_TABLET | Freq: Two times a day (BID) | ORAL | Status: DC
  Administered 2015-09-29 – 2015-10-01 (×5): 2 via ORAL
  Filled 2015-09-29: qty 1
  Filled 2015-09-29 (×4): qty 2
  Filled 2015-09-29 (×2): qty 1

## 2015-09-29 MED ORDER — ALBUTEROL SULFATE (2.5 MG/3ML) 0.083% IN NEBU
3.0000 mL | INHALATION_SOLUTION | RESPIRATORY_TRACT | Status: DC | PRN
  Filled 2015-09-29: qty 3

## 2015-09-29 MED ORDER — SODIUM CHLORIDE 0.9 % IV SOLN
INTRAVENOUS | Status: DC
  Administered 2015-09-29: 05:00:00 via INTRAVENOUS

## 2015-09-29 MED ORDER — SODIUM CHLORIDE 0.9 % WEIGHT BASED INFUSION
3.0000 mL/kg/h | INTRAVENOUS | Status: DC
  Administered 2015-09-30: 3 mL/kg/h via INTRAVENOUS

## 2015-09-29 MED ORDER — ISOSORBIDE MONONITRATE ER 30 MG PO TB24
30.0000 mg | ORAL_TABLET | Freq: Every day | ORAL | Status: DC
  Administered 2015-09-29 – 2015-10-01 (×3): 30 mg via ORAL
  Filled 2015-09-29 (×3): qty 1

## 2015-09-29 MED ORDER — SODIUM CHLORIDE 0.9 % IV SOLN: 250.0000 mL | INTRAVENOUS | Status: DC | PRN

## 2015-09-29 MED ORDER — REGADENOSON 0.4 MG/5ML IV SOLN
0.4000 mg | Freq: Once | INTRAVENOUS | Status: AC
  Administered 2015-09-29: 0.4 mg via INTRAVENOUS
  Filled 2015-09-29: qty 5

## 2015-09-29 MED ORDER — TECHNETIUM TC 99M SESTAMIBI GENERIC - CARDIOLITE
30.0000 | Freq: Once | INTRAVENOUS | Status: AC | PRN
  Administered 2015-09-29: 30 via INTRAVENOUS

## 2015-09-29 MED ORDER — SODIUM CHLORIDE 0.9% FLUSH: 3.0000 mL | INTRAVENOUS | Status: DC | PRN

## 2015-09-29 MED ORDER — ZOLPIDEM TARTRATE 5 MG PO TABS
10.0000 mg | ORAL_TABLET | Freq: Every evening | ORAL | Status: DC | PRN
  Administered 2015-09-29: 10 mg via ORAL
  Filled 2015-09-29 (×2): qty 2

## 2015-09-29 MED ORDER — SODIUM CHLORIDE 0.9 % WEIGHT BASED INFUSION
1.0000 mL/kg/h | INTRAVENOUS | Status: DC
  Administered 2015-09-30 (×2): 1 mL/kg/h via INTRAVENOUS
  Administered 2015-09-30: 250 mL via INTRAVENOUS

## 2015-09-29 MED ORDER — MORPHINE SULFATE (PF) 2 MG/ML IV SOLN
2.0000 mg | INTRAVENOUS | Status: DC | PRN
  Administered 2015-09-30: 2 mg via INTRAVENOUS
  Filled 2015-09-29: qty 1

## 2015-09-29 MED ORDER — SULFAMETHOXAZOLE-TRIMETHOPRIM 800-160 MG PO TABS
1.0000 | ORAL_TABLET | Freq: Once | ORAL | Status: AC
Start: 2015-09-29 — End: 2015-09-29
  Administered 2015-09-29: 1 via ORAL
  Filled 2015-09-29: qty 1

## 2015-09-29 MED ORDER — ACETAMINOPHEN 500 MG PO TABS
1000.0000 mg | ORAL_TABLET | Freq: Once | ORAL | Status: AC
  Administered 2015-09-29: 1000 mg via ORAL
  Filled 2015-09-29: qty 2

## 2015-09-29 NOTE — ED Notes (Signed)
MD at bedside. 

## 2015-09-29 NOTE — Care Management Note (Signed)
Case Management Note Lee Pierini RN, BSN Unit 2W-Case Manager [phone]  Patient Details  Name: Lee May MRN: [id number] Date of Birth: January 26, 1961  Subjective/Objective:  Pt admitted with chest pain                  Action/Plan: PTA pt lived at home- independent- anticipate return home- referral for potential medication needs- spoke with pt at bedside- per conversation pt reports that he was suppose to f/u with the [facility] but never established care there- is open to doing so now- will call for appointment- reviewed pt's medications - depending on what medications pt is discharged on may need assistance through [facility]-  Pt could use the [facility] pharmacy - will need Generic $4 meds if possible- statin also needs to be generic $4 if pt is going to be able to afford long term. (lovastatin would be on the $4 list) CM to continue to follow post stress test for possible MATCH need- call made to [facility]- no appointments available- clinic asked for pt to call on Monday 5/1- to schedule appointment- per their records pt has never established care at the clinic.   Expected Discharge Date:                  Expected Discharge Plan:  Home/Self Care  In-House Referral:     Discharge planning Services  CM Consult, Indigent Health Clinic, Medication Assistance  Post Acute Care Choice:    Choice offered to:     DME Arranged:    DME Agency:     HH Arranged:    HH Agency:     Status of Service:  Completed, signed off  Medicare Important Message Given:    Date Medicare IM Given:    Medicare IM give by:    Date Additional Medicare IM Given:    Additional Medicare Important Message give by:     If discussed at Long Length of Stay Meetings, dates discussed:    Additional Comments:  Darrold Span, RN 09/29/2015, 11:52 AM

## 2015-09-29 NOTE — Progress Notes (Signed)
PROGRESS NOTE        PATIENT DETAILS Name: Lee May Age: 55 y.o. Sex: male Date of Birth: [birth year] Admit Date: 09/28/2015 Admitting Physician Joella Prince, MD PCP:No primary care provider on file. Outpatient Specialists:Dr Turner  Brief Narrative: Patient is a 55 y.o. male with PMHx of CAD s/p CABG in 2015-non compliant to medications, ongoing tobacco abuse-admitted with unstable Angina. Nuclear Stress positive for ischemia. Cards eval in progress.  Subjective: Minimal chest pain this am  Assessment/Plan: Principal Problem: Unstable Angina: minimal chest pain this am-trop negative-EKG with new Twave flattening in inf-lat leads. Underwent Nuc Stress test on 4/26-+ for ischemia-cards eval in progress. Unfortunately non compliant to medications and with ongoing tobacco abuse.   Chest pain with high risk for cardiac etiology. Continue ASA, Lipitor and Metoprolol. Add Imdur. Await further recommendations from cardiology  Active Problems: Right axillary cellulitis with adenitis: continue bactrim. Follow  Dyslipidemia:LDL 160-resume Lipitor-non compliant. Check LFT's in am  YJE:HUDJSHFW Metoprolol-add Imdur. Follow  Tobacco Abuse:counseled-continue transdermal nicotine  Suspected COPD-stable at present-stable for outpatient elective PFT's. Continue prn bronchodilators  DVT Prophylaxis: Prophylactic Lovenox   Code Status: Full code   Family Communication: None at bedside  Disposition Plan: Remain inpatient-home once work up complete  Antimicrobial agents: Bactrim 4/26  Procedures: Nuc stress test 4/26>>ischemia in inferior, basal inferolateral, mid inferior, mid inferolateral and apical lateral location.  CONSULTS:  cardiology  Time spent: 25 minutes-Greater than 50% of this time was spent in counseling, explanation of diagnosis, planning of further management, and coordination of care.  MEDICATIONS: Anti-infectives    Start      Dose/Rate Route Frequency Ordered Stop   09/29/15 1000  sulfamethoxazole-trimethoprim (BACTRIM DS,SEPTRA DS) 800-160 MG per tablet 2 tablet     2 tablet Oral Every 12 hours 09/29/15 0326     09/29/15 0100  sulfamethoxazole-trimethoprim (BACTRIM DS,SEPTRA DS) 800-160 MG per tablet 1 tablet     1 tablet Oral  Once 09/29/15 0049 09/29/15 0145      Scheduled Meds: . aspirin EC  81 mg Oral Daily  . atorvastatin  80 mg Oral Daily  . enoxaparin (LOVENOX) injection  40 mg Subcutaneous Q24H  . ipratropium-albuterol  3 mL Nebulization TID  . metoprolol tartrate  25 mg Oral BID  . nicotine  21 mg Transdermal Daily  . sulfamethoxazole-trimethoprim  2 tablet Oral Q12H  . terbinafine   Topical BID   Continuous Infusions: . sodium chloride 100 mL/hr at 09/29/15 0512   PRN Meds:.acetaminophen, albuterol, morphine injection, ondansetron (ZOFRAN) IV   PHYSICAL EXAM: Vital signs: Filed Vitals:   09/29/15 0954 09/29/15 0956 09/29/15 0957 09/29/15 1435  BP: 114/76 108/74    Pulse: 79 76 76   Temp:      TempSrc:      Resp:      Height:      Weight:      SpO2:    94%   Filed Weights   09/29/15 0259  Weight: 104.7 kg (230 lb 13.2 oz)   Body mass index is 31.3 kg/(m^2).   Gen Exam: Awake and alert with clear speech. Not in any distress  Neck: Supple, No JVD.   Chest: B/L Clear.   CVS: S1 S2 Regular, no murmurs.  Abdomen: soft, BS +, non tender, non distended.  Extremities: no edema, lower extremities warm to touch. Neurologic: Non  Focal.   Skin: No Rash or lesions   Wounds: N/A.   LABORATORY DATA: CBC:  Recent Labs Lab 09/29/15 0040 09/29/15 0355  WBC 12.4* 10.6*  NEUTROABS 9.4*  --   HGB 16.2 15.7  HCT 47.6 46.9  MCV 88.3 89.8  PLT 296 271    Basic Metabolic Panel:  Recent Labs Lab 09/29/15 0040 09/29/15 0355  NA 138  --   K 3.9  --   CL 105  --   CO2 25  --   GLUCOSE 97  --   BUN 17  --   CREATININE 1.03 0.48*  CALCIUM 8.9  --     GFR: Estimated  Creatinine Clearance: 135.1 mL/min (by C-G formula based on Cr of 0.48).  Liver Function Tests: No results for input(s): AST, ALT, ALKPHOS, BILITOT, PROT, ALBUMIN in the last 168 hours. No results for input(s): LIPASE, AMYLASE in the last 168 hours. No results for input(s): AMMONIA in the last 168 hours.  Coagulation Profile: No results for input(s): INR, PROTIME in the last 168 hours.  Cardiac Enzymes:  Recent Labs Lab 09/29/15 0745 09/29/15 1048 09/29/15 1245  TROPONINI 0.03 0.03 0.03    BNP (last 3 results) No results for input(s): PROBNP in the last 8760 hours.  HbA1C: No results for input(s): HGBA1C in the last 72 hours.  CBG: No results for input(s): GLUCAP in the last 168 hours.  Lipid Profile:  Recent Labs  09/29/15 0355  CHOL 221*  HDL 46  LDLCALC 160*  TRIG 77  CHOLHDL 4.8    Thyroid Function Tests: No results for input(s): TSH, T4TOTAL, FREET4, T3FREE, THYROIDAB in the last 72 hours.  Anemia Panel: No results for input(s): VITAMINB12, FOLATE, FERRITIN, TIBC, IRON, RETICCTPCT in the last 72 hours.  Urine analysis:    Component Value Date/Time   COLORURINE AMBER* 01/22/2014 2133   APPEARANCEUR CLOUDY* 01/22/2014 2133   LABSPEC 1.028 01/22/2014 2133   PHURINE 5.5 01/22/2014 2133   GLUCOSEU NEGATIVE 01/22/2014 2133   HGBUR NEGATIVE 01/22/2014 2133   BILIRUBINUR NEGATIVE 01/22/2014 2133   KETONESUR NEGATIVE 01/22/2014 2133   PROTEINUR NEGATIVE 01/22/2014 2133   UROBILINOGEN 0.2 01/22/2014 2133   NITRITE NEGATIVE 01/22/2014 2133   LEUKOCYTESUR NEGATIVE 01/22/2014 2133    Sepsis Labs: Lactic Acid, Venous No results found for: LATICACIDVEN  MICROBIOLOGY: No results found for this or any previous visit (from the past 240 hour(s)).  RADIOLOGY STUDIES/RESULTS: Dg Chest 2 View  09/29/2015  CLINICAL DATA:  Chest pain and shortness of breath EXAM: CHEST  2 VIEW COMPARISON:  03/22/2014 FINDINGS: Normal heart size and stable mediastinal contours.  Post CABG. There is no edema, consolidation, effusion, or pneumothorax. No acute osseous finding. IMPRESSION: No evidence of active disease. Electronically Signed   By: Marnee Spring M.D.   On: 09/29/2015 01:42   Nm Myocar Multi W/spect W/wall Motion / Ef  09/29/2015   There was no ST segment deviation noted during stress.  No T wave inversion was noted during stress.  Defect 1: There is a large defect of severe severity present in the basal inferior, basal inferolateral, mid inferior, mid inferolateral and apical lateral location.  This is a high risk study.  Nuclear stress EF: 40%.  The left ventricular ejection fraction is moderately decreased (30-44%).  Findings consistent with ischemia.  High risk stress nuclear study with ischemia in the distribution of the left circumflex coronary artery and moderately depressed global LV systolic function.   09/29/2015  : Please select  correct template. Electronically Signed   By: Jolaine Click M.D.   On: 09/29/2015 13:26       Jeoffrey Massed, MD  [facility] Pager:[phone]  If 7PM-7AM, please contact night-coverage [email or web address] Password [facility] 09/29/2015, 3:58 PM

## 2015-09-29 NOTE — ED Notes (Signed)
Patient transported to X-ray 

## 2015-09-29 NOTE — ED Notes (Signed)
Pt has large reddened area to axilla on right side. Pt reports painful and possible ringworm to left forearm.

## 2015-09-29 NOTE — ED Notes (Signed)
Admitting at bedside 

## 2015-09-29 NOTE — H&P (Addendum)
[facility] History and Physical  Lee May ZOX:[id number] DOB: 04-Apr-1961 DOA: 09/28/2015  Referring physician:  PCP: Doris Cheadle, MD   Chief Complaint: I had chest pain while I was cooking  HPI: Lee May is a 55 y.o. male with past medical history significant for hypertension, hyperlipidemia, coronary artery disease status post ST elevation MI in 2015 that required CABG who is noncompliant with his medication at home said this evening while he was cooking dinner he developed a left-sided chest pain which he said was about 3 out of 10 in severity, was described as a dull ache. He denied any associated lightheadedness dizziness, so stated shortness of breath, did not have any palpitations. There was no radiation into his neck or his left arm. He was not eating food at the time, denied any symptoms of indigestion. He denied pleuritic pain, has not recently had any cough or congestion. Said when he had his heart attack in 2015 symptoms were much more severe. Because of the recurrence of a came back into the hospital. Additionally patient also complained of some swelling and pustules initially under his left arm but now it's under his right arm. He has some surrounding erythema and pain associated. Said he was unable to apply deodorant because it was so tender. Again denied any fever chills or sweating.  In the emergency department his vitals were stable, saturating 90% on room air. CBC and metabolic panel were benign, his initial troponin was 0.02, chest x-ray shows no evidence of vascular congestion, EKG showed normal sinus rhythm with some flattened T waves in his lateral leads. He was given a full dose of aspirin, also given a dose of Bactrim for his right axillary cellulitis. Shortly thereafter hospitalist called for chest pain   Review of Systems:  Constitutional:  No weight loss, night sweats, Fevers, chills, fatigue.  HEENT:  No headaches, Difficulty  swallowing,Tooth/dental problems,Sore throat, Cardio-vascular: Chest pain per history of present illness, denied Orthopnea, PND, swelling in lower extremities, anasarca, dizziness, palpitations  GI:  No heartburn, indigestion, abdominal pain, nausea, vomiting, diarrhea, change in bowel habits, loss of appetite  Resp: Shortness of breath associated with chest pain. No excess mucus, no productive cough, No non-productive cough, No coughing up of blood.No change in color of mucus.No wheezing.No chest wall deformity  Skin: Patient with area of redness and swelling under his right axilla, surrounding erythema and pain. Left axilla recently had pustules he trained GU:  no dysuria, change in color of urine, no urgency or frequency. No flank pain.  Musculoskeletal:   No joint pain or swelling. No decreased range of motion. No back pain.  Psych:  No change in mood or affect. No depression or anxiety. No memory loss.  Neuro:  No change in sensation, unilateral strength, or cognitive abilities  All other systems were reviewed and are negative.  Past Medical History  Diagnosis Date  . MI (myocardial infarction) (HCC)   . CAD (coronary artery disease), native coronary artery   . ST elevation (STEMI) myocardial infarction involving left anterior descending coronary artery (HCC) 12/18/2013    Cath: early mLAD 100% after mod D1, mRCA 100% (L-R collaterals), Om1 80% ostial --> PTCA of LAD --> CABG x 5 ; Echo: EF 35-40% - hypokinesis of inferior, inferoseptal and distal posterior walls; apical dyskinesis  . 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 Laneta Simmers): LIMA-LAD, SVG-rAM, SeqSVG-RPDA-PL, SVG-OM  . Heavy smoker     3PPD  . Essential hypertension   .  Dyslipidemia, goal LDL below 70   . Pneumonia   . PUD (peptic ulcer disease)   . COPD (chronic obstructive pulmonary disease) (HCC)   . Cardiomyopathy, ischemic     resolved and EF now 55-60% by echo 03/2014   Past Surgical History  Procedure Laterality Date  .  Coronary angioplasty with stent placement    . Fracture surgery    . Coronary artery bypass graft N/A 12/18/2013    Procedure: CORONARY ARTERY BYPASS GRAFTING (CABG);  Surgeon: Alleen Borne, MD;  [facility];  Service: Open Heart Surgery;  Laterality: N/A;  . Intraoperative transesophageal echocardiogram N/A 12/18/2013    Procedure: INTRAOPERATIVE TRANSESOPHAGEAL ECHOCARDIOGRAM;  Surgeon: Alleen Borne, MD;  Location: [facility] OR;  Service: Open Heart Surgery;  Laterality: N/A;  . Left heart catheterization with coronary angiogram N/A 12/18/2013    Procedure: LEFT HEART CATHETERIZATION WITH CORONARY ANGIOGRAM;  Surgeon: Marykay Lex, MD;  Location: [facility] CATH LAB;  Service: Cardiovascular;  Laterality: N/A;   Social History:  reports that he has been smoking Cigarettes.  He has a 60 pack-year smoking history. He does not have any smokeless tobacco history on file. He reports that he does not drink alcohol or use illicit drugs.  No Known Allergies  Family History  Problem Relation Age of Onset  . Cancer Other   . CAD Other   . Cancer Father   . Hypertension Father   . Stroke Father   . Heart disease Mother   . Heart attack    . Heart attack Neg Hx      Prior to Admission medications   Medication Sig Start Date End Date Taking? Authorizing Provider  albuterol (PROVENTIL HFA;VENTOLIN HFA) 108 (90 BASE) MCG/ACT inhaler Inhale 2 puffs into the lungs every 4 (four) hours as needed for wheezing or shortness of breath. Patient not taking: Reported on 09/29/2015 03/22/14   Cathren Laine, MD  aspirin EC 81 MG tablet Take 1 tablet (81 mg total) by mouth daily. Patient not taking: Reported on 09/29/2015 03/23/14   Quintella Reichert, MD  atorvastatin (LIPITOR) 80 MG tablet Take 1 tablet (80 mg total) by mouth daily. Patient not taking: Reported on 09/29/2015 03/02/14   Beatrice Lecher, PA-C  azithromycin (ZITHROMAX Z-PAK) 250 MG tablet Take as directed Patient not taking: Reported on 09/29/2015 03/22/14    Cathren Laine, MD   Physical Exam: Filed Vitals:   09/28/15 2350 09/29/15 0030 09/29/15 0200 09/29/15 0259  BP:  135/82 126/77 116/89  Pulse:  80 78 77  Temp:    97.7 F (36.5 C)  TempSrc:    Oral  Resp:  16 37   Height:    6' (1.829 m)  Weight:    104.7 kg (230 lb 13.2 oz)  SpO2: 96% 99% 96% 98%    Wt Readings from Last 3 Encounters:  09/29/15 104.7 kg (230 lb 13.2 oz)  03/23/14 100.699 kg (222 lb)  02/04/14 101.152 kg (223 lb)    General:  Appears calm and comfortable Eyes:  PERRL, EOMI, normal lids, iris ENT:  grossly normal hearing, lips & tongue Neck:  no LAD, masses or thyromegaly Cardiovascular:  RRR, no m/r/g. No LE edema.  Respiratory:  CTA bilaterally, no w/r/r. Normal respiratory effort. Abdomen:  soft, ntnd Skin: rash And induration under his right axilla Musculoskeletal:  grossly normal tone BUE/BLE Psychiatric:  grossly normal mood and affect, speech fluent and appropriate Neurologic:  CN 2-12 grossly intact, moves all extremities in coordinated  fashion.          Labs on Admission:  Basic Metabolic Panel:  Recent Labs Lab 09/29/15 0040  NA 138  K 3.9  CL 105  CO2 25  GLUCOSE 97  BUN 17  CREATININE 1.03  CALCIUM 8.9   Liver Function Tests: No results for input(s): AST, ALT, ALKPHOS, BILITOT, PROT, ALBUMIN in the last 168 hours. No results for input(s): LIPASE, AMYLASE in the last 168 hours. No results for input(s): AMMONIA in the last 168 hours. CBC:  Recent Labs Lab 09/29/15 0040  WBC 12.4*  NEUTROABS 9.4*  HGB 16.2  HCT 47.6  MCV 88.3  PLT 296   Cardiac Enzymes: No results for input(s): CKTOTAL, CKMB, CKMBINDEX, TROPONINI in the last 168 hours.  BNP (last 3 results)  Recent Labs  09/29/15 0040  BNP 91.5    ProBNP (last 3 results) No results for input(s): PROBNP in the last 8760 hours.   CREATININE: 1.03 (09/29/15 0040) Estimated creatinine clearance - 104.9 mL/min  CBG: No results for input(s): GLUCAP in the last 168  hours.  Radiological Exams on Admission: Dg Chest 2 View  09/29/2015  CLINICAL DATA:  Chest pain and shortness of breath EXAM: CHEST  2 VIEW COMPARISON:  03/22/2014 FINDINGS: Normal heart size and stable mediastinal contours. Post CABG. There is no edema, consolidation, effusion, or pneumothorax. No acute osseous finding. IMPRESSION: No evidence of active disease. Electronically Signed   By: Marnee Spring M.D.   On: 09/29/2015 01:42    EKG: Independently reviewed. Normal sinus rhythm, he has what appears to be flattened T waves in the lateral leads which appears to be new compared to 2015  Assessment/Plan Active Problems:   Chest pain   Cellulitis   1. Chest pain. Patient has risk factors including coronary artery disease status post CABG, hypertension, hyperlipidemia and noncompliance of medication. Initial troponin was 0.02, we'll continue to cycle every 6 hours for 2 more sets. Monitor on telemetry. Patient with suspected underlying COPD and difficulty exercising. Given his noncompliance and history of CABG I've ordered a nuclear stress test to be done in the morning if his cardiac enzymes are negative. Provide when necessary morphine and nitroglycerin for ongoing chest pain  2. Right axillary cellulitis. Patient received Bactrim in the emergency room, continue double strength twice a day.  3. Hyperlipidemia. Continue atorvastatin 80 mg daily, check lipid panel  4. Tobacco abuse. Continue topical NicoDerm 21 mg daily  5. Suspected COPD. Patient without any appreciable wheezes at this time, continue when necessary Proventil every 4 hours when necessary. In the outpatient setting if patient has not already had them would perform PFTs.  6. Tinea corporis. Topical terbinafine BID to left arm.    Code Status: Full code  DVT Prophylaxis: Lovenox 40 mg daily Family Communication: Absent from bedside Disposition Plan: Pending Improvement    Joella Prince, MD Family Medicine [facility] [email or web address] Password [email or web address]

## 2015-09-29 NOTE — Consult Note (Signed)
Patient ID: Lee May MRN: [id number], DOB/AGE: 1961-03-30   Admit date: 09/28/2015   Primary Physician: No primary care provider on file. Primary Cardiologist: Dr. Mayford Knife  Pt. Profile:  55 y/o male with h/o CAD s/p anterior STEMI 12/2013 treated with CABG x 5, Ischemic cardiomyopathy, tobacco abuse, HTN, HLD, PUD and h/o noncompliance with office f/u, admitted for CP. He ruled out with negative cardiac enzymes x 3 however NST is high risk.   Problem List  Past Medical History  Diagnosis Date  . MI (myocardial infarction) (HCC)   . CAD (coronary artery disease), native coronary artery   . ST elevation (STEMI) myocardial infarction involving left anterior descending coronary artery (HCC) 12/18/2013    Cath: early mLAD 100% after mod D1, mRCA 100% (L-R collaterals), Om1 80% ostial --> PTCA of LAD --> CABG x 5 ; Echo: EF 35-40% - hypokinesis of inferior, inferoseptal and distal posterior walls; apical dyskinesis  . 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 Laneta Simmers): LIMA-LAD, SVG-rAM, SeqSVG-RPDA-PL, SVG-OM  . Heavy smoker     3PPD  . Essential hypertension   . Dyslipidemia, goal LDL below 70   . Pneumonia   . PUD (peptic ulcer disease)   . COPD (chronic obstructive pulmonary disease) (HCC)   . Cardiomyopathy, ischemic     resolved and EF now 55-60% by echo 03/2014    Past Surgical History  Procedure Laterality Date  . Coronary angioplasty with stent placement    . Fracture surgery    . Coronary artery bypass graft N/A 12/18/2013    Procedure: CORONARY ARTERY BYPASS GRAFTING (CABG);  Surgeon: Alleen Borne, MD;  Location: [facility] OR;  Service: Open Heart Surgery;  Laterality: N/A;  . Intraoperative transesophageal echocardiogram N/A 12/18/2013    Procedure: INTRAOPERATIVE TRANSESOPHAGEAL ECHOCARDIOGRAM;  Surgeon: Alleen Borne, MD;  Location: [facility] OR;  Service: Open Heart Surgery;  Laterality: N/A;  . Left heart catheterization with coronary angiogram N/A 12/18/2013    Procedure: LEFT  HEART CATHETERIZATION WITH CORONARY ANGIOGRAM;  Surgeon: Marykay Lex, MD;  Location: [facility] CATH LAB;  Service: Cardiovascular;  Laterality: N/A;     Allergies  No Known Allergies  HPI  55 y.o. male with a hx of CAD s/p remote MI with PCI in [place] 2009, HTN, HL. He was admitted 12/2013 with an ant-lat STEMI. Emergent cardiac catheterization demonstrated 3 vessel CAD with acute occlusion of the LAD. LAD was treated with POBA. Echo demonstrated EF of 35-40%. He underwent CABG with Dr. Laneta Simmers on 12/19/13 (LIMA-LAD, SVG-OM1, SVG-AM, SVG-PDA/PL). He was readmitted 7/30-8/1 with acute pyloric channel ulcer. He was treated with Protonix.He was lost to f/u. Hasn't seen Dr. Mayford Knife since 03/2014. 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 He is a heavy smoker, smoking on average 2 PPM. He denies any recent issues with PUD. No melena.   Now presented to [facility] 09/30/15 with complaint of sudden onset of dyspnea accompanied by substernal to left sided chest discomfort, but not as severe has his angina prior to undergoing CABG in 2015. Symptoms did not improve, prompting him to report to the ED. Also in retrospect, he recalls recent mild DOE over the last several weeks, that he attributed to being overweight.   Initial w/u in the ED was unremarkable. He was admitted by IM. He ruled out for MI with negative enzymes x  3. He underwent a NST earlier today that was interpreted as a high risk study. Cardiology asked to evaluate. Renal function is normal. He is currently CP free.      Home Medications  Prior to Admission medications   Medication Sig Start Date End Date Taking? Authorizing Provider  albuterol (PROVENTIL HFA;VENTOLIN HFA) 108 (90 BASE) MCG/ACT inhaler Inhale 2 puffs into the lungs every 4 (four) hours as needed for wheezing or shortness of breath. Patient not taking: Reported on 09/29/2015  03/22/14   Cathren Laine, MD  aspirin EC 81 MG tablet Take 1 tablet (81 mg total) by mouth daily. Patient not taking: Reported on 09/29/2015 03/23/14   Quintella Reichert, MD  atorvastatin (LIPITOR) 80 MG tablet Take 1 tablet (80 mg total) by mouth daily. Patient not taking: Reported on 09/29/2015 03/02/14   Beatrice Lecher, PA-C  azithromycin (ZITHROMAX Z-PAK) 250 MG tablet Take as directed Patient not taking: Reported on 09/29/2015 03/22/14   Cathren Laine, MD    Family History  Family History  Problem Relation Age of Onset  . Cancer Other   . CAD Other   . Cancer Father   . Hypertension Father   . Stroke Father   . Heart disease Mother   . Heart attack    . Heart attack Neg Hx     Social History  Social History   Social History  . Marital Status: Divorced    Spouse Name: N/A  . Number of Children: N/A  . Years of Education: N/A   Occupational History  . Not on file.   Social History Main Topics  . Smoking status: Current Every Day Smoker -- 3.00 packs/day for 20 years    Types: Cigarettes  . Smokeless tobacco: Not on file     Comment: states he quit smoking 12/17/13 but was seen smoking outside clinic  . Alcohol Use: No  . Drug Use: No  . Sexual Activity: Not on file   Other Topics Concern  . Not on file   Social History Narrative   Divorced Plumber   Prior to STEMI 2-3  PPD smoker     Review of Systems General:  No chills, fever, night sweats or weight changes.  Cardiovascular:  No chest pain, dyspnea on exertion, edema, orthopnea, palpitations, paroxysmal nocturnal dyspnea. Dermatological: No rash, lesions/masses Respiratory: No cough, dyspnea Urologic: No hematuria, dysuria Abdominal:   No nausea, vomiting, diarrhea, bright red blood per rectum, melena, or hematemesis Neurologic:  No visual changes, wkns, changes in mental status. All other systems reviewed and are otherwise negative except as noted above.  Physical Exam  Blood pressure 121/67, pulse 64,  temperature 97.7 F (36.5 C), temperature source Oral, resp. rate 20, height 6' (1.829 m), weight 230 lb 13.2 oz (104.7 kg), SpO2 98 %.  General: Pleasant, NAD Psych: Normal affect. Neuro: Alert and oriented X 3. Moves all extremities spontaneously. HEENT: Normal  Neck: Supple without bruits or JVD. Lungs:  Resp regular and unlabored, CTA. Heart: RRR no s3, s4, or murmurs. Abdomen: Soft, non-tender, non-distended, BS + x 4.  Extremities: No clubbing, cyanosis or edema. DP/PT/Radials 2+ and equal bilaterally.  Labs  Troponin [facility] Test)  Recent Labs  09/29/15 0055  TROPIPOC 0.02    Recent Labs  09/29/15 0745 09/29/15 1048 09/29/15 1245  TROPONINI 0.03 0.03 0.03   Lab Results  Component Value Date   WBC 10.6* 09/29/2015   HGB 15.7 09/29/2015   HCT 46.9 09/29/2015  MCV 89.8 09/29/2015   PLT 271 09/29/2015     Recent Labs Lab 09/29/15 0040 09/29/15 0355  NA 138  --   K 3.9  --   CL 105  --   CO2 25  --   BUN 17  --   CREATININE 1.03 0.48*  CALCIUM 8.9  --   GLUCOSE 97  --    Lab Results  Component Value Date   CHOL 221* 09/29/2015   HDL 46 09/29/2015   LDLCALC 160* 09/29/2015   TRIG 77 09/29/2015   Lab Results  Component Value Date   DDIMER 0.46 09/08/2012     Radiology/Studies  Dg Chest 2 View  09/29/2015  CLINICAL DATA:  Chest pain and shortness of breath EXAM: CHEST  2 VIEW COMPARISON:  03/22/2014 FINDINGS: Normal heart size and stable mediastinal contours. Post CABG. There is no edema, consolidation, effusion, or pneumothorax. No acute osseous finding. IMPRESSION: No evidence of active disease. Electronically Signed   By: Marnee Spring M.D.   On: 09/29/2015 01:42   Nm Myocar Multi W/spect W/wall Motion / Ef  09/29/2015   There was no ST segment deviation noted during stress.  No T wave inversion was noted during stress.  Defect 1: There is a large defect of severe severity present in the basal inferior, basal inferolateral, mid  inferior, mid inferolateral and apical lateral location.  This is a high risk study.  Nuclear stress EF: 40%.  The left ventricular ejection fraction is moderately decreased (30-44%).  Findings consistent with ischemia.  High risk stress nuclear study with ischemia in the distribution of the left circumflex coronary artery and moderately depressed global LV systolic function.   09/29/2015  : Please select correct template. Electronically Signed   By: Jolaine Click M.D.   On: 09/29/2015 13:26    ECG  NSR with PVC  ASSESSMENT AND PLAN  Principal Problem:   Chest pain with high risk for cardiac etiology Active Problems:   S/P CABG x 5 on 12/19/13   Coronary artery disease involving native coronary artery of native heart with unstable angina pectoris (HCC)   Heavy smoker   Essential hypertension   Dyslipidemia, goal LDL below 70   PUD (peptic ulcer disease)   Cardiomyopathy, ischemic   Cellulitis   1. Chest Pain with High Risk for Cardiac Etiology: known coronary disease in a 55 y/o male heavy smoker with HTN. HLD and noncompliance with meds and clinic f/u. Recent symptomatolgy + high risk NST findings suggest cardiac etiology. LHC has been recommended to reassess his graft and native coronary anatomy. Risk of LHC explained including risk for death, MI, stroke, bleeding/vascular complications, renal impairment and allergic reaction to contrast dye. Patient agrees to proceed. We will make NPO at midnight and will plan LHC +/- PCI with Dr. Eldridge Dace tomorrow. Continue ASA, High intensity statin, BB and LA nitrate. He will need case manegement to assist with medication needs. If he undergoes PCI, may consider enrolling in [facility], so patient would qualify for free DAPT as a participant. We also discussed important need for smoking cessation. Will need to further encourage this through the remainder of his hospitalization. Given h/o PUD, would add a PPI for GI protection if DAPT is needed.     Signed, Robbie Lis, PA-C 09/29/2015, 4:16 PM  I have seen, examined and evaluated the patient this PM along with Ms. Sharol Harness, PA-C in consultation following his Myoview ST.  After reviewing all the available data and chart,  I agree with her findings, examination as well as impression recommendations.  Very pleasant 55 year old gentleman with a history of known coronary  Disease status post  CABG in July 2015 last seen by cardiology in October2015 who presented with an atypical sensation in his chest yesterday.  He has not had any antecedent symptoms, however.  Given his cardiac risk factors and known  Coronary disease, he was sent for a nuclear stress test, we are now seeing him as his ST was read as HIGH RISK with Inferolateral Ischemia c/w Circumflex distribution ischemia & reduced EF.  Most notable RF is continued smoking.  Given the presentation that is concerning for recurrent angina and high risk stress test, the most prudent course of action is to proceed with cardiac catheterization tomorrow.  Recommendations:  Will post for Cardiac Catheterization +/- PCI tomorrow & place orders  Continue aspirin, statin, Imdur, beta blocker  Smoking cessation counseling mandatory  He will need to ensure cardiology follow-up after discharge   Performing MD:  Dr. Eldridge Dace, M.D.  Procedure:  LEFT HEART CATHETERIZATION WITH CORONARY ANGIOGRAPHY AND POSSIBLE PERCUTANEOUS CORONARY INTERVENTION  The procedure with Risks/Benefits/Alternatives and Indications was reviewed with the patient.  All questions were answered.    Risks / Complications include, but not limited to: Death, MI, CVA/TIA, VF/VT (with defibrillation), Bradycardia (need for temporary pacer placement), contrast induced nephropathy, bleeding / bruising / hematoma / pseudoaneurysm, vascular or coronary injury (with possible emergent CT or Vascular Surgery), adverse medication reactions, infection.  Additional risks involving  the use of radiation with the possibility of radiation burns and cancer were explained in detail.  The patient voice understanding and agree to proceed.      Marykay Lex, M.D., M.S. Interventional Cardiologist   Pager # [phone] Phone # [phone] [address] [place], [place] [address]

## 2015-09-29 NOTE — ED Notes (Signed)
EKG given to dr Manus Gunning

## 2015-09-29 NOTE — ED Provider Notes (Signed)
By signing my name below, I, Freida Busman, attest that this documentation has been prepared under the direction and in the presence of Kristen N Ward, DO . Electronically Signed: Freida Busman, Scribe. 09/29/2015. 12:39 AM.  TIME SEEN: 12:32 AM  CHIEF COMPLAINT:  Chief Complaint  Patient presents with  . Shortness of Breath     HPI:   HPI Comments:  NADIM MALIA is a 55 y.o. male with a history of tobacco use, STEMI in 2015 with Subsequent CABG and COPD,  who presents to the Emergency Department complaining of SOB onset ~ 2200 which was constant for ~ 1 hour. He notes his symptom has improved at this time. Pt notes associated mild left sided throbbing, pressure-like chest pain, diaphoresis, and dizziness which began with the SOB; all of which have resolved at this time. Pt was getting ready to eat when his symptoms began. He also notes productive cough which also began tonight with clear sputum. Pt had a CABG 2 years ago; notes CP tonight was similar to the CP felt at that time except last time it was more constant. He has not been on any meds x ~ 1 year. No recent stress test. He denies nausea and fever.  No alleviating or exacerbating factors noted. States she is still smoker. He does not have a PCP or cardiologist. He did [place] PCI [place] approximately [place] as well.  Also reports a rash to his left arm he has had for several months. 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 No other new exposures. Thinks that it is ringworm. Also complains of redness and small bumps to the right axilla that he has had intermittently for weeks as well with some drainage. No fevers.  No PCP   ROS: See HPI Constitutional: no fever  Eyes: no drainage  ENT: no runny nose   Cardiovascular:   chest pain  Resp: SOB  GI: no vomiting GU: no dysuria Integumentary: no rash  Allergy: no hives  Musculoskeletal: no leg swelling  Neurological: no slurred speech ROS otherwise  negative  PAST MEDICAL HISTORY/PAST SURGICAL HISTORY:  Past Medical History  Diagnosis Date  . MI (myocardial infarction) (HCC)   . CAD (coronary artery disease), native coronary artery   . ST elevation (STEMI) myocardial infarction involving left anterior descending coronary artery (HCC) 12/18/2013    Cath: early mLAD 100% after mod D1, mRCA 100% (L-R collaterals), Om1 80% ostial --> PTCA of LAD --> CABG x 5 ; Echo: EF 35-40% - hypokinesis of inferior, inferoseptal and distal posterior walls; apical dyskinesis  . 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 Laneta Simmers): LIMA-LAD, SVG-rAM, SeqSVG-RPDA-PL, SVG-OM  . Heavy smoker     3PPD  . Essential hypertension   . Dyslipidemia, goal LDL below 70   . Pneumonia   . PUD (peptic ulcer disease)   . COPD (chronic obstructive pulmonary disease) (HCC)   . Cardiomyopathy, ischemic     resolved and EF now 55-60% by echo 03/2014    MEDICATIONS:  Prior to Admission medications   Medication Sig Start Date End Date Taking? Authorizing Provider  albuterol (PROVENTIL HFA;VENTOLIN HFA) 108 (90 BASE) MCG/ACT inhaler Inhale 2 puffs into the lungs every 4 (four) hours as needed for wheezing or shortness of breath. Patient not taking: Reported on 09/29/2015 03/22/14   Cathren Laine, MD  aspirin EC 81 MG tablet Take 1 tablet (81 mg total) by mouth daily. Patient not taking: Reported on 09/29/2015  03/23/14   Quintella Reichert, MD  atorvastatin (LIPITOR) 80 MG tablet Take 1 tablet (80 mg total) by mouth daily. Patient not taking: Reported on 09/29/2015 03/02/14   Beatrice Lecher, PA-C  azithromycin (ZITHROMAX Z-PAK) 250 MG tablet Take as directed Patient not taking: Reported on 09/29/2015 03/22/14   Cathren Laine, MD    ALLERGIES:  No Known Allergies  SOCIAL HISTORY:  Social History  Substance Use Topics  . Smoking status: Current Every Day Smoker -- 3.00 packs/day for 20 years    Types: Cigarettes  . Smokeless tobacco: Not on file     Comment: states he quit smoking 12/17/13  but was seen smoking outside clinic  . Alcohol Use: No    FAMILY HISTORY: Family History  Problem Relation Age of Onset  . Cancer Other   . CAD Other   . Cancer Father   . Hypertension Father   . Stroke Father   . Heart disease Mother   . Heart attack    . Heart attack Neg Hx     EXAM: BP 135/82 mmHg  Pulse 80  Temp(Src) 97.9 F (36.6 C) (Oral)  Resp 16  SpO2 99% CONSTITUTIONAL: Alert and oriented and responds appropriately to questions. Chronically ill-appearing, in no significant distress HEAD: Normocephalic EYES: Conjunctivae clear, PERRL ENT: normal nose; no rhinorrhea; moist mucous membranes NECK: Supple, no meningismus, no LAD  CARD: RRR; S1 and S2 appreciated; no murmurs, no clicks, no rubs, no gallops RESP: Normal chest excursion without splinting or tachypnea; breath sounds clear and equal bilaterally; no wheezes, no rhonchi, no rales, no hypoxia or respiratory distress, speaking full sentences ABD/GI: Normal bowel sounds; non-distended; soft, non-tender, no rebound, no guarding, no peritoneal signs BACK:  The back appears normal and is non-tender to palpation, there is no CVA tenderness EXT: Normal ROM in all joints; non-tender to palpation; no edema; normal capillary refill; no cyanosis, no calf tenderness or swelling    SKIN: Normal color for age and race; warm; no rash; eczematous rash with raised border to the upper left arm that looks like a fungal infection with no sign of superimposed cellulitis, patient also has 3 small abscesses to the right axilla without fluctuance or drainage, one of these areas has approximately 2 x 3 similar area of redness and warmth consistent with cellulitis NEURO: Moves all extremities equally, sensation to light touch intact diffusely, cranial nerves II through XII intact PSYCH: The patient's mood and manner are appropriate. Grooming and personal hygiene are appropriate.  MEDICAL DECISION MAKING: Patient here with concerning story  for chest pain. Has a significant cardiac history. Currently pain-free. EKG shows nonspecific lateral T wave changes that are new compared to prior. We'll give aspirin, obtain cardiac labs, chest x-ray. I feel he will need admission for cardiac rule out.  Patient also appears to have ringworm or other fungal infection to the left upper extremity without superimposed infection. He also appears to have 3 small abscesses that do not appear to be amenable to incision and drainage under the right axilla with one area of surrounding cellulitis. No systemic symptoms. Afebrile, nontoxic. We'll start him on Bactrim.  ED PROGRESS: Patient's labs are unremarkable other than mild leukocytosis which appears chronic for patient. Troponin negative. BNP negative. Chest x-ray clear. We'll discuss with medicine for admission.   Discussed patient's case with hospitalist, Dr. Lendell Caprice.  Recommend admission to telemetry, observation bed.  I will place holding orders per their request. Patient and family (if present) updated with  plan. Care transferred to hospitalist service.  I reviewed all nursing notes, vitals, pertinent old records, EKGs, labs, imaging (as available).      EKG Interpretation  Date/Time:  Wednesday September 29 2015 00:05:26 EDT Ventricular Rate:  85 PR Interval:  149 QRS Duration: 102 QT Interval:  351 QTC Calculation: 417 R Axis:   61 Text Interpretation:  Sinus rhythm Ventricular premature complex Probable left atrial enlargement RSR' in V1 or V2, right VCD or RVH Borderline T abnormalities, lateral leads that are new compared to prior Confirmed by WARD,  DO, KRISTEN [phone]) on 09/29/2015 12:13:35 AM       I personally performed the services described in this documentation, which was scribed in my presence. The recorded information has been reviewed and is accurate.    Layla Maw Ward, DO 09/29/15 [phone]

## 2015-09-30 ENCOUNTER — Encounter (HOSPITAL_COMMUNITY)
Admission: EM | Disposition: A | Discharge status: Home / Self Care | Payer: Self-pay | Source: Home / Self Care | Attending: Emergency Medicine

## 2015-09-30 DIAGNOSIS — L03111 Cellulitis of right axilla: Secondary | ICD-10-CM

## 2015-09-30 DIAGNOSIS — I2510 Atherosclerotic heart disease of native coronary artery without angina pectoris: Secondary | ICD-10-CM

## 2015-09-30 DIAGNOSIS — R931 Abnormal findings on diagnostic imaging of heart and coronary circulation: Secondary | ICD-10-CM

## 2015-09-30 DIAGNOSIS — R9439 Abnormal result of other cardiovascular function study: Secondary | ICD-10-CM | POA: Clinically undetermined

## 2015-09-30 HISTORY — PX: CARDIAC CATHETERIZATION: SHX172

## 2015-09-30 LAB — BASIC METABOLIC PANEL
Anion gap: 8 (ref 5–15)
BUN: 12 mg/dL (ref 6–20)
CALCIUM: 8.2 mg/dL — AB (ref 8.9–10.3)
CHLORIDE: 106 mmol/L (ref 101–111)
CO2: 24 mmol/L (ref 22–32)
CREATININE: 1.08 mg/dL (ref 0.61–1.24)
GFR calc non Af Amer: >60 mL/min (ref >60)
Glucose, Bld: 90 mg/dL (ref 65–99)
Potassium: 3.7 mmol/L (ref 3.5–5.1)
SODIUM: 138 mmol/L (ref 135–145)

## 2015-09-30 LAB — CREATININE, SERUM
CREATININE: 1.07 mg/dL (ref 0.61–1.24)
GFR calc Af Amer: >60 mL/min (ref >60)

## 2015-09-30 LAB — CBC
HCT: 42 % (ref 39.0–52.0)
HEMATOCRIT: 44.9 % (ref 39.0–52.0)
HEMOGLOBIN: 14.8 g/dL (ref 13.0–17.0)
Hemoglobin: 14.1 g/dL (ref 13.0–17.0)
MCH: 29.4 pg (ref 26.0–34.0)
MCH: 30 pg (ref 26.0–34.0)
MCHC: 33 g/dL (ref 30.0–36.0)
MCHC: 33.6 g/dL (ref 30.0–36.0)
MCV: 89.1 fL (ref 78.0–100.0)
MCV: 89.4 fL (ref 78.0–100.0)
PLATELETS: 257 10*3/uL (ref 150–400)
Platelets: 273 10*3/uL (ref 150–400)
RBC: 5.04 MIL/uL (ref 4.22–5.81)
RBC: 4.7 MIL/uL (ref 4.22–5.81)
RDW: 13.9 % (ref 11.5–15.5)
RDW: 14 % (ref 11.5–15.5)
WBC: 9.8 10*3/uL (ref 4.0–10.5)
WBC: 10.4 10*3/uL (ref 4.0–10.5)

## 2015-09-30 LAB — PROTIME-INR
INR: 0.98 (ref 0.00–1.49)
PROTHROMBIN TIME: 13.2 s (ref 11.6–15.2)

## 2015-09-30 SURGERY — LEFT HEART CATH AND CORONARY ANGIOGRAPHY
Anesthesia: LOCAL

## 2015-09-30 MED ORDER — VERAPAMIL HCL 2.5 MG/ML IV SOLN
INTRAVENOUS | Status: DC | PRN
  Administered 2015-09-30: 10 mL via INTRA_ARTERIAL

## 2015-09-30 MED ORDER — ONDANSETRON HCL 4 MG/2ML IJ SOLN: 4.0000 mg | Freq: Four times a day (QID) | INTRAMUSCULAR | Status: DC | PRN

## 2015-09-30 MED ORDER — MIDAZOLAM HCL 2 MG/2ML IJ SOLN
INTRAMUSCULAR | Status: AC
  Filled 2015-09-30: qty 2

## 2015-09-30 MED ORDER — VARENICLINE TARTRATE 0.5 MG PO TABS: 0.5000 mg | ORAL_TABLET | Freq: Two times a day (BID) | ORAL | Status: DC

## 2015-09-30 MED ORDER — LIDOCAINE HCL (PF) 1 % IJ SOLN
INTRAMUSCULAR | Status: AC
  Filled 2015-09-30: qty 30

## 2015-09-30 MED ORDER — ACETAMINOPHEN 325 MG PO TABS: 650.0000 mg | ORAL_TABLET | ORAL | Status: DC | PRN

## 2015-09-30 MED ORDER — MIDAZOLAM HCL 2 MG/2ML IJ SOLN
INTRAMUSCULAR | Status: DC | PRN
  Administered 2015-09-30: 1 mg via INTRAVENOUS
  Administered 2015-09-30: 2 mg via INTRAVENOUS
  Administered 2015-09-30: 1 mg via INTRAVENOUS

## 2015-09-30 MED ORDER — SODIUM CHLORIDE 0.9% FLUSH
3.0000 mL | Freq: Two times a day (BID) | INTRAVENOUS | Status: DC
  Administered 2015-09-30: 3 mL via INTRAVENOUS

## 2015-09-30 MED ORDER — SODIUM CHLORIDE 0.9 % IV SOLN: 250.0000 mL | INTRAVENOUS | Status: DC | PRN

## 2015-09-30 MED ORDER — IOPAMIDOL (ISOVUE-370) INJECTION 76%
INTRAVENOUS | Status: AC
  Filled 2015-09-30: qty 50

## 2015-09-30 MED ORDER — VARENICLINE TARTRATE 0.5 MG PO TABS
0.5000 mg | ORAL_TABLET | Freq: Every day | ORAL | Status: DC
  Administered 2015-09-30 – 2015-10-01 (×2): 0.5 mg via ORAL
  Filled 2015-09-30 (×2): qty 1

## 2015-09-30 MED ORDER — VERAPAMIL HCL 2.5 MG/ML IV SOLN
INTRAVENOUS | Status: AC
  Filled 2015-09-30: qty 2

## 2015-09-30 MED ORDER — LIDOCAINE HCL (PF) 1 % IJ SOLN
INTRAMUSCULAR | Status: DC | PRN
  Administered 2015-09-30: 2 mL via INTRADERMAL

## 2015-09-30 MED ORDER — HEPARIN SODIUM (PORCINE) 5000 UNIT/ML IJ SOLN: 5000.0000 [IU] | Freq: Three times a day (TID) | INTRAMUSCULAR | Status: DC

## 2015-09-30 MED ORDER — IOPAMIDOL (ISOVUE-370) INJECTION 76%
INTRAVENOUS | Status: DC | PRN
  Administered 2015-09-30: 185 mL via INTRA_ARTERIAL

## 2015-09-30 MED ORDER — HEPARIN SODIUM (PORCINE) 1000 UNIT/ML IJ SOLN
INTRAMUSCULAR | Status: AC
  Filled 2015-09-30: qty 1

## 2015-09-30 MED ORDER — SODIUM CHLORIDE 0.9% FLUSH: 3.0000 mL | INTRAVENOUS | Status: DC | PRN

## 2015-09-30 MED ORDER — SODIUM CHLORIDE 0.9 % WEIGHT BASED INFUSION: 1.0000 mL/kg/h | INTRAVENOUS | Status: AC

## 2015-09-30 MED ORDER — FENTANYL CITRATE (PF) 100 MCG/2ML IJ SOLN
INTRAMUSCULAR | Status: DC | PRN
  Administered 2015-09-30: 25 ug via INTRAVENOUS
  Administered 2015-09-30: 50 ug via INTRAVENOUS

## 2015-09-30 MED ORDER — HEPARIN SODIUM (PORCINE) 1000 UNIT/ML IJ SOLN
INTRAMUSCULAR | Status: DC | PRN
  Administered 2015-09-30: 5000 [IU] via INTRAVENOUS

## 2015-09-30 MED ORDER — FENTANYL CITRATE (PF) 100 MCG/2ML IJ SOLN
INTRAMUSCULAR | Status: AC
  Filled 2015-09-30: qty 2

## 2015-09-30 MED ORDER — HEPARIN (PORCINE) IN NACL 2-0.9 UNIT/ML-% IJ SOLN
INTRAMUSCULAR | Status: AC
  Filled 2015-09-30: qty 1500

## 2015-09-30 MED ORDER — VARENICLINE TARTRATE 1 MG PO TABS: 1.0000 mg | ORAL_TABLET | Freq: Two times a day (BID) | ORAL | Status: DC

## 2015-09-30 MED ORDER — HEPARIN (PORCINE) IN NACL 2-0.9 UNIT/ML-% IJ SOLN
INTRAMUSCULAR | Status: DC | PRN
  Administered 2015-09-30: 1500 mL

## 2015-09-30 SURGICAL SUPPLY — 14 items
CATH INFINITI 5 FR AR1 MOD (CATHETERS) ×3 IMPLANT
CATH INFINITI 5 FR RCB (CATHETERS) ×3 IMPLANT
CATH INFINITI 5FR AL1 (CATHETERS) ×3 IMPLANT
CATH INFINITI 5FR ANG PIGTAIL (CATHETERS) ×3 IMPLANT
CATH INFINITI 5FR JL4 (CATHETERS) ×3 IMPLANT
CATH INFINITI JR4 5F (CATHETERS) ×3 IMPLANT
DEVICE RAD COMP TR BAND LRG (VASCULAR PRODUCTS) ×3 IMPLANT
GLIDESHEATH SLEND SS 6F .021 (SHEATH) ×3 IMPLANT
KIT HEART LEFT (KITS) ×3 IMPLANT
PACK CARDIAC CATHETERIZATION (CUSTOM PROCEDURE TRAY) ×3 IMPLANT
SYR MEDRAD MARK V 150ML (SYRINGE) ×3 IMPLANT
TRANSDUCER W/STOPCOCK (MISCELLANEOUS) ×3 IMPLANT
TUBING CIL FLEX 10 FLL-RA (TUBING) ×3 IMPLANT
WIRE SAFE-T 1.5MM-J .035X260CM (WIRE) ×3 IMPLANT

## 2015-09-30 NOTE — Progress Notes (Signed)
PROGRESS NOTE        PATIENT DETAILS Name: Lee May Age: 55 y.o. Sex: male Date of Birth: [birth year] Admit Date: 09/28/2015 Admitting Physician Joella Prince, MD PCP:No primary care provider on file. Outpatient Specialists:Dr Turner  Brief Narrative: Patient is a 55 y.o. male with PMHx of CAD s/p CABG in 2015-non compliant to medications, ongoing tobacco abuse-admitted with unstable Angina. Nuclear Stress positive for ischemia. Cardiology planning LHC today.  Subjective: Chest pain is improving. Spoke with him about the importance of smoking cessation and medication compliance, in which he stated he was unable to afford medication. Discussed putting him on lower cost options to improve compliance and he was agreeable.   Assessment/Plan: Principal Problem: Unstable Angina: minimal chest pain this am-trop negative-EKG with new Twave flattening in inf-lat leads. Underwent Nuc Stress test on 4/26-+ for ischemia-cards eval in progress. Unfortunately non compliant to medications and with ongoing tobacco abuse.   Chest pain with high risk for cardiac etiology. Continue ASA, Lipitor and Metoprolol, and Imdur. LHC scheduled for today.   Active Problems: Right axillary cellulitis with adenitis: continue bactrim. Follow  Dyslipidemia: LDL 160-resume Lipitor-non compliant. Follow  YBW:LSLHTDSK Metoprolol-add Imdur. Follow  Tobacco Abuse:counseled-continue transdermal nicotine  Suspected COPD-stable at present-stable for outpatient elective PFT's. Continue prn bronchodilators  DVT Prophylaxis: Prophylactic Lovenox   Code Status: Full code   Family Communication: None at bedside  Disposition Plan: Remain inpatient-home once work up complete  Antimicrobial agents: Bactrim 4/26  Procedures: Nuc stress test 4/26>>ischemia in inferior, basal inferolateral, mid inferior, mid inferolateral and apical lateral location.  CONSULTS:  cardiology  Time  spent: 25 minutes-Greater than 50% of this time was spent in counseling, explanation of diagnosis, planning of further management, and coordination of care.  MEDICATIONS: Anti-infectives    Start     Dose/Rate Route Frequency Ordered Stop   09/29/15 1000  sulfamethoxazole-trimethoprim (BACTRIM DS,SEPTRA DS) 800-160 MG per tablet 2 tablet     2 tablet Oral Every 12 hours 09/29/15 0326     09/29/15 0100  sulfamethoxazole-trimethoprim (BACTRIM DS,SEPTRA DS) 800-160 MG per tablet 1 tablet     1 tablet Oral  Once 09/29/15 0049 09/29/15 0145      Scheduled Meds: . aspirin EC  325 mg Oral Daily  . atorvastatin  80 mg Oral Daily  . enoxaparin (LOVENOX) injection  40 mg Subcutaneous Q24H  . ipratropium-albuterol  3 mL Nebulization TID  . isosorbide mononitrate  30 mg Oral Daily  . metoprolol tartrate  25 mg Oral BID  . nicotine  21 mg Transdermal Daily  . sodium chloride flush  3 mL Intravenous Q12H  . sulfamethoxazole-trimethoprim  2 tablet Oral Q12H  . terbinafine   Topical BID   Continuous Infusions: . sodium chloride 1 mL/kg/hr (09/30/15 0802)   PRN Meds:.sodium chloride, acetaminophen, albuterol, morphine injection, ondansetron (ZOFRAN) IV, sodium chloride flush, zolpidem   PHYSICAL EXAM: Vital signs: Filed Vitals:   09/29/15 1928 09/29/15 2055 09/30/15 0444 09/30/15 0709  BP:  109/53 94/54   Pulse:  70 72 59  Temp:  97.8 F (36.6 C) 97.8 F (36.6 C)   TempSrc:  Oral Oral   Resp:  18 18 18   Height:      Weight:      SpO2: 98% 96% 96% 94%   Filed Weights   09/29/15 0259  Weight: 104.7 kg (  230 lb 13.2 oz)   Body mass index is 31.3 kg/(m^2).   Gen Exam: Awake and alert with clear speech. Not in any distress  Neck: Supple, No JVD.   Chest: B/L Clear.   CVS: S1 S2 Regular, no murmurs.  Abdomen: soft, BS +, non tender, non distended.  Extremities: no edema, lower extremities warm to touch. Neurologic: Non Focal.   Skin: No Rash or lesions   Wounds: N/A.    LABORATORY DATA: CBC:  Recent Labs Lab 09/29/15 0040 09/29/15 0355 09/30/15 0306  WBC 12.4* 10.6* 9.8  NEUTROABS 9.4*  --   --   HGB 16.2 15.7 14.8  HCT 47.6 46.9 44.9  MCV 88.3 89.8 89.1  PLT 296 271 273    Basic Metabolic Panel:  Recent Labs Lab 09/29/15 0040 09/29/15 0355 09/30/15 0306  NA 138  --  138  K 3.9  --  3.7  CL 105  --  106  CO2 25  --  24  GLUCOSE 97  --  90  BUN 17  --  12  CREATININE 1.03 0.48* 1.08  CALCIUM 8.9  --  8.2*    GFR: Estimated Creatinine Clearance: 100 mL/min (by C-G formula based on Cr of 1.08).  Liver Function Tests: No results for input(s): AST, ALT, ALKPHOS, BILITOT, PROT, ALBUMIN in the last 168 hours. No results for input(s): LIPASE, AMYLASE in the last 168 hours. No results for input(s): AMMONIA in the last 168 hours.  Coagulation Profile:  Recent Labs Lab 09/30/15 0306  INR 0.98    Cardiac Enzymes:  Recent Labs Lab 09/29/15 0745 09/29/15 1048 09/29/15 1245  TROPONINI 0.03 0.03 0.03    BNP (last 3 results) No results for input(s): PROBNP in the last 8760 hours.  HbA1C: No results for input(s): HGBA1C in the last 72 hours.  CBG: No results for input(s): GLUCAP in the last 168 hours.  Lipid Profile:  Recent Labs  09/29/15 0355  CHOL 221*  HDL 46  LDLCALC 160*  TRIG 77  CHOLHDL 4.8    Thyroid Function Tests: No results for input(s): TSH, T4TOTAL, FREET4, T3FREE, THYROIDAB in the last 72 hours.  Anemia Panel: No results for input(s): VITAMINB12, FOLATE, FERRITIN, TIBC, IRON, RETICCTPCT in the last 72 hours.  Urine analysis:    Component Value Date/Time   COLORURINE AMBER* 01/22/2014 2133   APPEARANCEUR CLOUDY* 01/22/2014 2133   LABSPEC 1.028 01/22/2014 2133   PHURINE 5.5 01/22/2014 2133   GLUCOSEU NEGATIVE 01/22/2014 2133   HGBUR NEGATIVE 01/22/2014 2133   BILIRUBINUR NEGATIVE 01/22/2014 2133   KETONESUR NEGATIVE 01/22/2014 2133   PROTEINUR NEGATIVE 01/22/2014 2133   UROBILINOGEN 0.2  01/22/2014 2133   NITRITE NEGATIVE 01/22/2014 2133   LEUKOCYTESUR NEGATIVE 01/22/2014 2133    Sepsis Labs: Lactic Acid, Venous No results found for: LATICACIDVEN  MICROBIOLOGY: No results found for this or any previous visit (from the past 240 hour(s)).  RADIOLOGY STUDIES/RESULTS: Dg Chest 2 View  09/29/2015  CLINICAL DATA:  Chest pain and shortness of breath EXAM: CHEST  2 VIEW COMPARISON:  03/22/2014 FINDINGS: Normal heart size and stable mediastinal contours. Post CABG. There is no edema, consolidation, effusion, or pneumothorax. No acute osseous finding. IMPRESSION: No evidence of active disease. Electronically Signed   By: Marnee Spring M.D.   On: 09/29/2015 01:42   Nm Myocar Multi W/spect W/wall Motion / Ef  09/29/2015   There was no ST segment deviation noted during stress.  No T wave inversion was noted during stress.  Defect 1: There is a large defect of severe severity present in the basal inferior, basal inferolateral, mid inferior, mid inferolateral and apical lateral location.  This is a high risk study.  Nuclear stress EF: 40%.  The left ventricular ejection fraction is moderately decreased (30-44%).  Findings consistent with ischemia.  High risk stress nuclear study with ischemia in the distribution of the left circumflex coronary artery and moderately depressed global LV systolic function.   09/29/2015  : Please select correct template. Electronically Signed   By: Jolaine Click M.D.   On: 09/29/2015 13:26       Jeoffrey Massed, MD [facility] Pager:[phone]  If 7PM-7AM, please contact night-coverage [email or web address] Password [facility] 09/30/2015, 8:43 AM

## 2015-09-30 NOTE — Progress Notes (Signed)
SUBJECTIVE:  Few episodes of brief CP  OBJECTIVE:   Vitals:   Filed Vitals:   09/29/15 1928 09/29/15 2055 09/30/15 0444 09/30/15 0709  BP:  109/53 94/54   Pulse:  70 72 59  Temp:  97.8 F (36.6 C) 97.8 F (36.6 C)   TempSrc:  Oral Oral   Resp:  18 18 18   Height:      Weight:      SpO2: 98% 96% 96% 94%   I&O's:   Intake/Output Summary (Last 24 hours) at 09/30/15 1014 Last data filed at 09/29/15 1700  Gross per 24 hour  Intake    480 ml  Output      0 ml  Net    480 ml   TELEMETRY: Reviewed telemetry pt in NSR:     PHYSICAL EXAM General: Well developed, well nourished, in no acute distress Head: Eyes PERRLA, No xanthomas.   Normal cephalic and atramatic  Lungs:   Clear bilaterally to auscultation and percussion. Heart:   HRRR S1 S2 Pulses are 2+ & equal. Abdomen: Bowel sounds are positive, abdomen soft and non-tender without masses  Extremities:   No clubbing, cyanosis or edema.  DP +1 Neuro: Alert and oriented X 3. Psych:  Good affect, responds appropriately   LABS: Basic Metabolic Panel:  Recent Labs  00/76/22 0040 09/29/15 0355 09/30/15 0306  NA 138  --  138  K 3.9  --  3.7  CL 105  --  106  CO2 25  --  24  GLUCOSE 97  --  90  BUN 17  --  12  CREATININE 1.03 0.48* 1.08  CALCIUM 8.9  --  8.2*   Liver Function Tests: No results for input(s): AST, ALT, ALKPHOS, BILITOT, PROT, ALBUMIN in the last 72 hours. No results for input(s): LIPASE, AMYLASE in the last 72 hours. CBC:  Recent Labs  09/29/15 0040 09/29/15 0355 09/30/15 0306  WBC 12.4* 10.6* 9.8  NEUTROABS 9.4*  --   --   HGB 16.2 15.7 14.8  HCT 47.6 46.9 44.9  MCV 88.3 89.8 89.1  PLT 296 271 273   Cardiac Enzymes:  Recent Labs  09/29/15 0745 09/29/15 1048 09/29/15 1245  TROPONINI 0.03 0.03 0.03   BNP: Invalid input(s): POCBNP D-Dimer: No results for input(s): DDIMER in the last 72 hours. Hemoglobin A1C: No results for input(s): HGBA1C in the last 72 hours. Fasting Lipid  Panel:  Recent Labs  09/29/15 0355  CHOL 221*  HDL 46  LDLCALC 160*  TRIG 77  CHOLHDL 4.8   Thyroid Function Tests: No results for input(s): TSH, T4TOTAL, T3FREE, THYROIDAB in the last 72 hours.  Invalid input(s): FREET3 Anemia Panel: No results for input(s): VITAMINB12, FOLATE, FERRITIN, TIBC, IRON, RETICCTPCT in the last 72 hours. Coag Panel:   Lab Results  Component Value Date   INR 0.98 09/30/2015   INR 1.33 12/18/2013   INR 1.17 12/18/2013    RADIOLOGY: Dg Chest 2 View  09/29/2015  CLINICAL DATA:  Chest pain and shortness of breath EXAM: CHEST  2 VIEW COMPARISON:  03/22/2014 FINDINGS: Normal heart size and stable mediastinal contours. Post CABG. There is no edema, consolidation, effusion, or pneumothorax. No acute osseous finding. IMPRESSION: No evidence of active disease. Electronically Signed   By: Marnee Spring M.D.   On: 09/29/2015 01:42   Nm Myocar Multi W/spect W/wall Motion / Ef  09/29/2015   There was no ST segment deviation noted during stress.  No T wave inversion was  noted during stress.  Defect 1: There is a large defect of severe severity present in the basal inferior, basal inferolateral, mid inferior, mid inferolateral and apical lateral location.  This is a high risk study.  Nuclear stress EF: 40%.  The left ventricular ejection fraction is moderately decreased (30-44%).  Findings consistent with ischemia.  High risk stress nuclear study with ischemia in the distribution of the left circumflex coronary artery and moderately depressed global LV systolic function.   09/29/2015  : Please select correct template. Electronically Signed   By: Jolaine Click M.D.   On: 09/29/2015 13:26    ASSESSMENT AND PLAN 55 y/o male with h/o CAD s/p anterior STEMI 12/2013 treated with CABG x 5, Ischemic cardiomyopathy, tobacco abuse, HTN, HLD, PUD and h/o noncompliance with office f/u, admitted for CP. 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 Chest Pain with High Risk for Cardiac Etiology: known coronary disease in a 55 y/o male heavy smoker with HTN. HLD and noncompliance with meds and clinic f/u. Recent symptomatolgy + high risk NST findings with inferolateral ischemia suggesting ischemia in the LCx territory.   Plan for  LHC  to reassess his graft and native coronary anatomy. He is scheduled with Dr. Eldridge Dace later today.   Continue ASA, High intensity statin, BB and LA nitrate. He will need case manegement to assist with medication needs. If he undergoes PCI, may consider enrolling in [facility], so patient would qualify for free DAPT as a participant. We also discussed important need for smoking cessation. Will need to further encourage this through the remainder of his hospitalization. Given h/o PUD, would add a PPI for GI protection if DAPT is needed. Check HBA1C.  2.  ASCAD s/p CABG 2015 - continue ASA/statin/BB  3.  Dyslipidemia on high dose statin - He has been off meds for about 1 year due to cost and LDL this admit was 160. Now on high dose statin.   4.  Tobacco abuse - ongoing - smoking cessation counseling per nursing. He says that nicotine patch has never helped.  He is interested in trying Chantix so will start on that.   5.  HTN - BP controlled on BB - he had been off meds for a year due to cost.     Quintella Reichert, MD  09/30/2015  10:14 AM

## 2015-09-30 NOTE — Interval H&P Note (Signed)
Cath Lab Visit (complete for each Cath Lab visit)  Clinical Evaluation Leading to the Procedure:   ACS: No.  Non-ACS:    Anginal Classification: CCS III  Anti-ischemic medical therapy: Minimal Therapy (1 class of medications)  Non-Invasive Test Results: High-risk stress test findings: cardiac mortality >3%/year  Prior CABG: Previous CABG      Cath Lab Visit (complete for each Cath Lab visit)  Clinical Evaluation Leading to the Procedure:   ACS: No.  Non-ACS:    Anginal Classification: CCS III  Anti-ischemic medical therapy: Minimal Therapy (1 class of medications)  Non-Invasive Test Results: High-risk stress test findings: cardiac mortality >3%/year  Prior CABG: Previous CABG      History and Physical Interval Note:  09/30/2015 2:05 PM  Lee May  has presented today for surgery, with the diagnosis of positive stress test  The various methods of treatment have been discussed with the patient and family. After consideration of risks, benefits and other options for treatment, the patient has consented to  Procedure(s): Left Heart Cath and Coronary Angiography (N/A) as a surgical intervention .  The patient's history has been reviewed, patient examined, no change in status, stable for surgery.  I have reviewed the patient's chart and labs.  Questions were answered to the patient's satisfaction.     Cornelis Kluver S.

## 2015-09-30 NOTE — H&P (View-Only) (Signed)
SUBJECTIVE:  Few episodes of brief CP  OBJECTIVE:   Vitals:   Filed Vitals:   09/29/15 1928 09/29/15 2055 09/30/15 0444 09/30/15 0709  BP:  109/53 94/54   Pulse:  70 72 59  Temp:  97.8 F (36.6 C) 97.8 F (36.6 C)   TempSrc:  Oral Oral   Resp:  18 18 18   Height:      Weight:      SpO2: 98% 96% 96% 94%   I&O's:   Intake/Output Summary (Last 24 hours) at 09/30/15 1014 Last data filed at 09/29/15 1700  Gross per 24 hour  Intake    480 ml  Output      0 ml  Net    480 ml   TELEMETRY: Reviewed telemetry pt in NSR:     PHYSICAL EXAM General: Well developed, well nourished, in no acute distress Head: Eyes PERRLA, No xanthomas.   Normal cephalic and atramatic  Lungs:   Clear bilaterally to auscultation and percussion. Heart:   HRRR S1 S2 Pulses are 2+ & equal. Abdomen: Bowel sounds are positive, abdomen soft and non-tender without masses  Extremities:   No clubbing, cyanosis or edema.  DP +1 Neuro: Alert and oriented X 3. Psych:  Good affect, responds appropriately   LABS: Basic Metabolic Panel:  Recent Labs  00/76/22 0040 09/29/15 0355 09/30/15 0306  NA 138  --  138  K 3.9  --  3.7  CL 105  --  106  CO2 25  --  24  GLUCOSE 97  --  90  BUN 17  --  12  CREATININE 1.03 0.48* 1.08  CALCIUM 8.9  --  8.2*   Liver Function Tests: No results for input(s): AST, ALT, ALKPHOS, BILITOT, PROT, ALBUMIN in the last 72 hours. No results for input(s): LIPASE, AMYLASE in the last 72 hours. CBC:  Recent Labs  09/29/15 0040 09/29/15 0355 09/30/15 0306  WBC 12.4* 10.6* 9.8  NEUTROABS 9.4*  --   --   HGB 16.2 15.7 14.8  HCT 47.6 46.9 44.9  MCV 88.3 89.8 89.1  PLT 296 271 273   Cardiac Enzymes:  Recent Labs  09/29/15 0745 09/29/15 1048 09/29/15 1245  TROPONINI 0.03 0.03 0.03   BNP: Invalid input(s): POCBNP D-Dimer: No results for input(s): DDIMER in the last 72 hours. Hemoglobin A1C: No results for input(s): HGBA1C in the last 72 hours. Fasting Lipid  Panel:  Recent Labs  09/29/15 0355  CHOL 221*  HDL 46  LDLCALC 160*  TRIG 77  CHOLHDL 4.8   Thyroid Function Tests: No results for input(s): TSH, T4TOTAL, T3FREE, THYROIDAB in the last 72 hours.  Invalid input(s): FREET3 Anemia Panel: No results for input(s): VITAMINB12, FOLATE, FERRITIN, TIBC, IRON, RETICCTPCT in the last 72 hours. Coag Panel:   Lab Results  Component Value Date   INR 0.98 09/30/2015   INR 1.33 12/18/2013   INR 1.17 12/18/2013    RADIOLOGY: Dg Chest 2 View  09/29/2015  CLINICAL DATA:  Chest pain and shortness of breath EXAM: CHEST  2 VIEW COMPARISON:  03/22/2014 FINDINGS: Normal heart size and stable mediastinal contours. Post CABG. There is no edema, consolidation, effusion, or pneumothorax. No acute osseous finding. IMPRESSION: No evidence of active disease. Electronically Signed   By: Marnee Spring M.D.   On: 09/29/2015 01:42   Nm Myocar Multi W/spect W/wall Motion / Ef  09/29/2015   There was no ST segment deviation noted during stress.  No T wave inversion was  noted during stress.  Defect 1: There is a large defect of severe severity present in the basal inferior, basal inferolateral, mid inferior, mid inferolateral and apical lateral location.  This is a high risk study.  Nuclear stress EF: 40%.  The left ventricular ejection fraction is moderately decreased (30-44%).  Findings consistent with ischemia.  High risk stress nuclear study with ischemia in the distribution of the left circumflex coronary artery and moderately depressed global LV systolic function.   09/29/2015  : Please select correct template. Electronically Signed   By: Arthur  Hoss M.D.   On: 09/29/2015 13:26    ASSESSMENT AND PLAN 55 y/o male with h/o CAD s/p anterior STEMI 12/2013 treated with CABG x 5, Ischemic cardiomyopathy, tobacco abuse, HTN, HLD, PUD and h/o noncompliance with office f/u, admitted for CP. 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 Chest Pain with High Risk for Cardiac Etiology: known coronary disease in a 55 y/o male heavy smoker with HTN. HLD and noncompliance with meds and clinic f/u. Recent symptomatolgy + high risk NST findings with inferolateral ischemia suggesting ischemia in the LCx territory.   Plan for  LHC  to reassess his graft and native coronary anatomy. He is scheduled with Dr. Varanasi later today.   Continue ASA, High intensity statin, BB and LA nitrate. He will need case manegement to assist with medication needs. If he undergoes PCI, may consider enrolling in Twilight Study, so patient would qualify for free DAPT as a participant. We also discussed important need for smoking cessation. Will need to further encourage this through the remainder of his hospitalization. Given h/o PUD, would add a PPI for GI protection if DAPT is needed. Check HBA1C.  2.  ASCAD s/p CABG 2015 - continue ASA/statin/BB  3.  Dyslipidemia on high dose statin - He has been off meds for about 1 year due to cost and LDL this admit was 160. Now on high dose statin.   4.  Tobacco abuse - ongoing - smoking cessation counseling per nursing. He says that nicotine patch has never helped.  He is interested in trying Chantix so will start on that.   5.  HTN - BP controlled on BB - he had been off meds for a year due to cost.     Ellana Kawa R, MD  09/30/2015  10:14 AM  

## 2015-10-01 ENCOUNTER — Encounter (HOSPITAL_COMMUNITY): Payer: Self-pay | Admitting: Interventional Cardiology

## 2015-10-01 LAB — BASIC METABOLIC PANEL
Anion gap: 6 (ref 5–15)
BUN: 10 mg/dL (ref 6–20)
CHLORIDE: 109 mmol/L (ref 101–111)
CO2: 25 mmol/L (ref 22–32)
CREATININE: 1.09 mg/dL (ref 0.61–1.24)
Calcium: 8.6 mg/dL — ABNORMAL LOW (ref 8.9–10.3)
GFR calc non Af Amer: >60 mL/min (ref >60)
Glucose, Bld: 93 mg/dL (ref 65–99)
POTASSIUM: 4.3 mmol/L (ref 3.5–5.1)
SODIUM: 140 mmol/L (ref 135–145)

## 2015-10-01 LAB — HEMOGLOBIN A1C
Hgb A1c MFr Bld: 6 % — ABNORMAL HIGH (ref 4.8–5.6)
Mean Plasma Glucose: 126 mg/dL

## 2015-10-01 MED ORDER — SULFAMETHOXAZOLE-TRIMETHOPRIM 800-160 MG PO TABS: 2.0000 | ORAL_TABLET | Freq: Two times a day (BID) | ORAL | Status: AC

## 2015-10-01 MED ORDER — ATORVASTATIN CALCIUM 80 MG PO TABS: 80.0000 mg | ORAL_TABLET | Freq: Every day | ORAL | Status: AC

## 2015-10-01 MED ORDER — NICOTINE 21 MG/24HR TD PT24: 21.0000 mg | MEDICATED_PATCH | Freq: Every day | TRANSDERMAL | Status: AC

## 2015-10-01 MED ORDER — ASPIRIN EC 81 MG PO TBEC
81.0000 mg | DELAYED_RELEASE_TABLET | Freq: Every day | ORAL | Status: DC
  Administered 2015-10-01 (×2): 81 mg via ORAL
  Filled 2015-10-01: qty 1

## 2015-10-01 MED ORDER — ALBUTEROL SULFATE HFA 108 (90 BASE) MCG/ACT IN AERS: 2.0000 | INHALATION_SPRAY | RESPIRATORY_TRACT | Status: AC | PRN

## 2015-10-01 MED ORDER — ISOSORBIDE MONONITRATE ER 30 MG PO TB24: 30.0000 mg | ORAL_TABLET | Freq: Every day | ORAL | Status: AC

## 2015-10-01 MED ORDER — ASPIRIN EC 81 MG PO TBEC: 81.0000 mg | DELAYED_RELEASE_TABLET | Freq: Every day | ORAL | Status: AC

## 2015-10-01 MED ORDER — IPRATROPIUM-ALBUTEROL 0.5-2.5 (3) MG/3ML IN SOLN
3.0000 mL | RESPIRATORY_TRACT | Status: DC | PRN
Start: 2015-10-01 — End: 2015-10-01

## 2015-10-01 MED ORDER — METOPROLOL TARTRATE 25 MG PO TABS: 25.0000 mg | ORAL_TABLET | Freq: Two times a day (BID) | ORAL | Status: AC

## 2015-10-01 NOTE — Discharge Summary (Signed)
PATIENT DETAILS Name: Lee May Age: 55 y.o. Sex: male Date of Birth: 10/08/1960 MRN: [id number]. Admitting Physician: Joella Prince, MD PCP:No primary care provider on file.  Admit Date: 09/28/2015 Discharge date: 10/01/2015  Recommendations for Outpatient Follow-up:  1. Counsel regarding importance of compliance to medications 2. Needs counseling regarding importance of avoiding tobacco abuse 3. Please repeat CBC/BMET at next visit 4. Ensure follow-up with cardiology  PRIMARY DISCHARGE DIAGNOSIS:  Principal Problem:   Chest pain with high risk for cardiac etiology Active Problems:   S/P CABG x 5 on 12/19/13   Heavy smoker   Essential hypertension   Dyslipidemia, goal LDL below 70   PUD (peptic ulcer disease)   Cardiomyopathy, ischemic   Cellulitis   Coronary artery disease involving native coronary artery of native heart with unstable angina pectoris (HCC)   Abnormal nuclear stress test   Cellulitis of right axilla      PAST MEDICAL HISTORY: Past Medical History  Diagnosis Date  . MI (myocardial infarction) (HCC)   . CAD (coronary artery disease), native coronary artery   . ST elevation (STEMI) myocardial infarction involving left anterior descending coronary artery (HCC) 12/18/2013    Cath: early mLAD 100% after mod D1, mRCA 100% (L-R collaterals), Om1 80% ostial --> PTCA of LAD --> CABG x 5 ; Echo: EF 35-40% - hypokinesis of inferior, inferoseptal and distal posterior walls; apical dyskinesis  . 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 Laneta Simmers): LIMA-LAD, SVG-rAM, SeqSVG-RPDA-PL, SVG-OM  . Heavy smoker     3PPD  . Essential hypertension   . Dyslipidemia, goal LDL below 70   . Pneumonia   . PUD (peptic ulcer disease)   . COPD (chronic obstructive pulmonary disease) (HCC)   . Cardiomyopathy, ischemic     resolved and EF now 55-60% by echo 03/2014    DISCHARGE MEDICATIONS: Current Discharge Medication List    START taking these medications   Details  isosorbide  mononitrate (IMDUR) 30 MG 24 hr tablet Take 1 tablet (30 mg total) by mouth daily. Qty: 30 tablet, Refills: 0    metoprolol tartrate (LOPRESSOR) 25 MG tablet Take 1 tablet (25 mg total) by mouth 2 (two) times daily. Qty: 60 tablet, Refills: 0    nicotine (NICODERM CQ) 21 mg/24hr patch Place 1 patch (21 mg total) onto the skin daily. Qty: 28 patch, Refills: 0    sulfamethoxazole-trimethoprim (BACTRIM DS,SEPTRA DS) 800-160 MG tablet Take 2 tablets by mouth every 12 (twelve) hours. Qty: 10 tablet, Refills: 0      CONTINUE these medications which have CHANGED   Details  albuterol (PROVENTIL HFA;VENTOLIN HFA) 108 (90 Base) MCG/ACT inhaler Inhale 2 puffs into the lungs every 4 (four) hours as needed for wheezing or shortness of breath. Qty: 1 Inhaler, Refills: 0    aspirin EC 81 MG tablet Take 1 tablet (81 mg total) by mouth daily.    atorvastatin (LIPITOR) 80 MG tablet Take 1 tablet (80 mg total) by mouth daily. Qty: 30 tablet, Refills: 0   Associated Diagnoses: Hyperlipemia      STOP taking these medications     azithromycin (ZITHROMAX Z-PAK) 250 MG tablet         ALLERGIES:  No Known Allergies  BRIEF HPI:  See H&P, Labs, Consult and Test reports for all details in brief, patient was admitted for Evaluation of unstable angina  CONSULTATIONS:   cardiology  PERTINENT RADIOLOGIC STUDIES: Dg Chest 2 View  09/29/2015  CLINICAL DATA:  Chest pain  and shortness of breath EXAM: CHEST  2 VIEW COMPARISON:  03/22/2014 FINDINGS: Normal heart size and stable mediastinal contours. Post CABG. There is no edema, consolidation, effusion, or pneumothorax. No acute osseous finding. IMPRESSION: No evidence of active disease. Electronically Signed   By: Marnee Spring M.D.   On: 09/29/2015 01:42   Nm Myocar Multi W/spect W/wall Motion / Ef  09/29/2015   There was no ST segment deviation noted during stress.  No T wave inversion was noted during stress.  Defect 1: There is a large defect of  severe severity present in the basal inferior, basal inferolateral, mid inferior, mid inferolateral and apical lateral location.  This is a high risk study.  Nuclear stress EF: 40%.  The left ventricular ejection fraction is moderately decreased (30-44%).  Findings consistent with ischemia.  High risk stress nuclear study with ischemia in the distribution of the left circumflex coronary artery and moderately depressed global LV systolic function.   09/29/2015  : Please select correct template. Electronically Signed   By: Jolaine Click M.D.   On: 09/29/2015 13:26     PERTINENT LAB RESULTS: CBC:  Recent Labs  09/30/15 0306 09/30/15 1542  WBC 9.8 10.4  HGB 14.8 14.1  HCT 44.9 42.0  PLT 273 257   CMET CMP     Component Value Date/Time   NA 140 10/01/2015 0946   K 4.3 10/01/2015 0946   CL 109 10/01/2015 0946   CO2 25 10/01/2015 0946   GLUCOSE 93 10/01/2015 0946   BUN 10 10/01/2015 0946   CREATININE 1.09 10/01/2015 0946   CALCIUM 8.6* 10/01/2015 0946   PROT 7.6 03/02/2014 0738   ALBUMIN 3.6 03/02/2014 0738   AST 16 03/02/2014 0738   ALT 18 03/02/2014 0738   ALKPHOS 88 03/02/2014 0738   BILITOT 0.5 03/02/2014 0738   GFRNONAA >60 10/01/2015 0946   GFRAA >60 10/01/2015 0946    GFR Estimated Creatinine Clearance: 99.1 mL/min (by C-G formula based on Cr of 1.09). No results for input(s): LIPASE, AMYLASE in the last 72 hours.  Recent Labs  09/29/15 0745 09/29/15 1048 09/29/15 1245  TROPONINI 0.03 0.03 0.03   Invalid input(s): POCBNP No results for input(s): DDIMER in the last 72 hours.  Recent Labs  09/30/15 1155  HGBA1C 6.0*    Recent Labs  09/29/15 0355  CHOL 221*  HDL 46  LDLCALC 160*  TRIG 77  CHOLHDL 4.8   No results for input(s): TSH, T4TOTAL, T3FREE, THYROIDAB in the last 72 hours.  Invalid input(s): FREET3 No results for input(s): VITAMINB12, FOLATE, FERRITIN, TIBC, IRON, RETICCTPCT in the last 72 hours. Coags:  Recent Labs  09/30/15 0306    INR 0.98   Microbiology: Recent Results (from the past 240 hour(s))  Culture, blood (routine x 2)     Status: None (Preliminary result)   Collection Time: 09/29/15  3:45 AM  Result Value Ref Range Status   Specimen Description BLOOD LEFT ARM  Final   Special Requests BOTTLES DRAWN AEROBIC AND ANAEROBIC  Final   Culture NO GROWTH 1 DAY  Final   Report Status PENDING  Incomplete  Culture, blood (routine x 2)     Status: None (Preliminary result)   Collection Time: 09/29/15  3:51 AM  Result Value Ref Range Status   Specimen Description BLOOD LEFT ARM  Final   Special Requests AEROBIC BOTTLE ONLY  Final   Culture NO GROWTH 1 DAY  Final   Report Status PENDING  Incomplete  BRIEF HOSPITAL COURSE:  Unstable Angina:Admitted with chest pain this am-trop negative-EKG with new Twave flattening in inf-lat leads. Underwent Nuc Stress test on 4/26-+ for ischemia-cards eval in progress. Unfortunately non compliant to medications and with ongoing tobacco abuse.Underwent LHC on 4/27 which showedshowed severe 3 vessel ASCAD with patent LIMA to LAD, patent SVG to OM and occluded SVG to PLA/PDA and occluded SVG to OM. Cardiology recommends medical management, continue aspirin, statin, beta blocker and nitrate on discharge.  Active Problems: Right axillary cellulitis with adenitis: continue bactrim. Follow with PCP  Dyslipidemia: LDL 160-resume Lipitor-non compliant. Follow lipid panel in the outpatient setting  ZOX:[id number] Metoprolol and Imdur. Follow  Tobacco Abuse:counseled-continue transdermal nicotine  Suspected COPD-stable at present-stable for outpatient elective PFT's. Continue prn bronchodilators  Prediabetes up with HBA1C 6: Follow closely in the outpatient setting  TODAY-DAY OF DISCHARGE:  Subjective:   Lee May today has no headache,no chest abdominal pain,no new weakness tingling or numbness, feels much better wants to go home today.   Objective:   Blood  pressure 112/77, pulse 61, temperature 97.8 F (36.6 C), temperature source Oral, resp. rate 18, height 6' (1.829 m), weight 104.7 kg (230 lb 13.2 oz), SpO2 100 %.  Intake/Output Summary (Last 24 hours) at 10/01/15 1125 Last data filed at 10/01/15 0941  Gross per 24 hour  Intake    440 ml  Output   2475 ml  Net  -2035 ml   Filed Weights   09/29/15 0259  Weight: 104.7 kg (230 lb 13.2 oz)    Exam Awake Alert, Oriented *3, No new F.N deficits, Normal affect Griffin.AT,PERRAL Supple Neck,No JVD, No cervical lymphadenopathy appriciated.  Symmetrical Chest wall movement, Good air movement bilaterally, CTAB RRR,No Gallops,Rubs or new Murmurs, No Parasternal Heave +ve B.Sounds, Abd Soft, Non tender, No organomegaly appriciated, No rebound -guarding or rigidity. No Cyanosis, Clubbing or edema, No new Rash or bruise  DISCHARGE CONDITION: Stable  DISPOSITION: Home  DISCHARGE INSTRUCTIONS:    Activity:  As tolerated   Get Medicines reviewed and adjusted: Please take all your medications with you for your next visit with your Primary MD  Please request your Primary MD to go over all hospital tests and procedure/radiological results at the follow up, please ask your Primary MD to get all Hospital records sent to his/her office.  If you experience worsening of your admission symptoms, develop shortness of breath, life threatening emergency, suicidal or homicidal thoughts you must seek medical attention immediately by calling 911 or calling your MD immediately  if symptoms less severe.  You must read complete instructions/literature along with all the possible adverse reactions/side effects for all the Medicines you take and that have been prescribed to you. Take any new Medicines after you have completely understood and accpet all the possible adverse reactions/side effects.   Do not drive when taking Pain medications.   Do not take more than prescribed Pain, Sleep and Anxiety  Medications  Special Instructions: If you have smoked or chewed Tobacco  in the last 2 yrs please stop smoking, stop any regular Alcohol  and or any Recreational drug use.  Wear Seat belts while driving.  Please note  You were cared for by a hospitalist during your hospital stay. Once you are discharged, your primary care physician will handle any further medical issues. Please note that NO REFILLS for any discharge medications will be authorized once you are discharged, as it is imperative that you return to your primary care physician (or establish a  relationship with a primary care physician if you do not have one) for your aftercare needs so that they can reassess your need for medications and monitor your lab values.   Diet recommendation: Heart Healthy diet  Discharge Instructions    Call MD for:  persistant nausea and vomiting    Complete by:  As directed      Call MD for:  severe uncontrolled pain    Complete by:  As directed      Diet - low sodium heart healthy    Complete by:  As directed      Increase activity slowly    Complete by:  As directed            Follow-up Information    Follow up with [facility] On 10/04/2015.   Why:  please call Monday 10/04/15- and ask for f/u appointment from Hospital discharge to establish care    Contact information:   [address] [place] [address] [phone]      Follow up with [facility].   Specialty:  Cardiology   Why:  Hospital follow up, Office will call with date/time, If you do not hear from them in 1 week,please giv   Contact information:   [address] [place] [address] [phone]      Total Time spent on discharge equals 45 minutes.  SignedJeoffrey Massed 10/01/2015 11:25 AM

## 2015-10-01 NOTE — Care Management Note (Signed)
Case Management Note Donn Pierini RN, BSN Unit 2W-Case Manager [phone]  Patient Details  Name: Lee May MRN: [id number] Date of Birth: [birth year]  Subjective/Objective:  Pt admitted with chest pain                  Action/Plan: PTA pt lived at home- independent- anticipate return home- referral for potential medication needs- spoke with pt at bedside- per conversation pt reports that he was suppose to f/u with the [facility] but never established care there- is open to doing so now- will call for appointment- reviewed pt's medications - depending on what medications pt is discharged on may need assistance through [facility]-  Pt could use the [facility] pharmacy - will need Generic $4 meds if possible- statin also needs to be generic $4 if pt is going to be able to afford long term. (lovastatin would be on the $4 list) CM to continue to follow post stress test for possible MATCH need- call made to [facility]- no appointments available- clinic asked for pt to call on Monday 5/1- to schedule appointment- per their records pt has never established care at the clinic.   Expected Discharge Date:     10/01/15             Expected Discharge Plan:  Home/Self Care  In-House Referral:     Discharge planning Services  CM Consult, Indigent Health Clinic, Medication Assistance, [facility] Program  Post Acute Care Choice:    Choice offered to:     DME Arranged:    DME Agency:     HH Arranged:    HH Agency:     Status of Service:  Completed, signed off  Medicare Important Message Given:    Date Medicare IM Given:    Medicare IM give by:    Date Additional Medicare IM Given:    Additional Medicare Important Message give by:     If discussed at Long Length of Stay Meetings, dates discussed:    Additional Comments:  10/01/15- 1100- Donn Pierini RN, BSN- pt for d/c home today- has info on [facility] will call Monday for f/u appointment- spoke with MD - pt will need assistance with medications- MD would like pt  to stay on Lipitor and not the $4 statin- will assist pt with [facility]- letter provided and given to pt at bedside - explained program and copay cost of $3 per script. Pt states that he uses [facility] or [facility]- either would be fine as both participate in program. No further CM needs noted.   Darrold Span, RN 10/01/2015, 1:44 PM

## 2015-10-01 NOTE — Progress Notes (Signed)
SUBJECTIVE:  Denies any chest pain or SOB  OBJECTIVE:   Vitals:   Filed Vitals:   09/30/15 1940 09/30/15 2024 10/01/15 0443 10/01/15 0709  BP:  102/52 112/77   Pulse:  81 68 61  Temp:  97.9 F (36.6 C) 97.8 F (36.6 C)   TempSrc:  Oral Oral   Resp:  14 18 18   Height:      Weight:      SpO2: 100% 96% 98% 100%   I&O's:   Intake/Output Summary (Last 24 hours) at 10/01/15 0839 Last data filed at 10/01/15 0600  Gross per 24 hour  Intake    200 ml  Output   2475 ml  Net  -2275 ml   TELEMETRY: Reviewed telemetry pt in NSR:     PHYSICAL EXAM General: Well developed, well nourished, in no acute distress Head: Eyes PERRLA, No xanthomas.   Normal cephalic and atramatic  Lungs:   Clear bilaterally to auscultation and percussion. Heart:   HRRR S1 S2 Pulses are 2+ & equal. Abdomen: Bowel sounds are positive, abdomen soft and non-tender without masses Extremities:   No clubbing, cyanosis or edema.  DP +1 Neuro: Alert and oriented X 3. Psych:  Good affect, responds appropriately   LABS: Basic Metabolic Panel:  Recent Labs  23/53/61 0040  09/30/15 0306 09/30/15 1542  NA 138  --  138  --   K 3.9  --  3.7  --   CL 105  --  106  --   CO2 25  --  24  --   GLUCOSE 97  --  90  --   BUN 17  --  12  --   CREATININE 1.03  < > 1.08 1.07  CALCIUM 8.9  --  8.2*  --   < > = values in this interval not displayed. Liver Function Tests: No results for input(s): AST, ALT, ALKPHOS, BILITOT, PROT, ALBUMIN in the last 72 hours. No results for input(s): LIPASE, AMYLASE in the last 72 hours. CBC:  Recent Labs  09/29/15 0040  09/30/15 0306 09/30/15 1542  WBC 12.4*  < > 9.8 10.4  NEUTROABS 9.4*  --   --   --   HGB 16.2  < > 14.8 14.1  HCT 47.6  < > 44.9 42.0  MCV 88.3  < > 89.1 89.4  PLT 296  < > 273 257  < > = values in this interval not displayed. Cardiac Enzymes:  Recent Labs  09/29/15 0745 09/29/15 1048 09/29/15 1245  TROPONINI 0.03 0.03 0.03   BNP: Invalid  input(s): POCBNP D-Dimer: No results for input(s): DDIMER in the last 72 hours. Hemoglobin A1C:  Recent Labs  09/30/15 1155  HGBA1C 6.0*   Fasting Lipid Panel:  Recent Labs  09/29/15 0355  CHOL 221*  HDL 46  LDLCALC 160*  TRIG 77  CHOLHDL 4.8   Thyroid Function Tests: No results for input(s): TSH, T4TOTAL, T3FREE, THYROIDAB in the last 72 hours.  Invalid input(s): FREET3 Anemia Panel: No results for input(s): VITAMINB12, FOLATE, FERRITIN, TIBC, IRON, RETICCTPCT in the last 72 hours. Coag Panel:   Lab Results  Component Value Date   INR 0.98 09/30/2015   INR 1.33 12/18/2013   INR 1.17 12/18/2013    RADIOLOGY: Dg Chest 2 View  09/29/2015  CLINICAL DATA:  Chest pain and shortness of breath EXAM: CHEST  2 VIEW COMPARISON:  03/22/2014 FINDINGS: Normal heart size and stable mediastinal contours. Post CABG. There is no edema, consolidation,  effusion, or pneumothorax. No acute osseous finding. IMPRESSION: No evidence of active disease. Electronically Signed   By: Marnee Spring M.D.   On: 09/29/2015 01:42   Nm Myocar Multi W/spect W/wall Motion / Ef  09/29/2015   There was no ST segment deviation noted during stress.  No T wave inversion was noted during stress.  Defect 1: There is a large defect of severe severity present in the basal inferior, basal inferolateral, mid inferior, mid inferolateral and apical lateral location.  This is a high risk study.  Nuclear stress EF: 40%.  The left ventricular ejection fraction is moderately decreased (30-44%).  Findings consistent with ischemia.  High risk stress nuclear study with ischemia in the distribution of the left circumflex coronary artery and moderately depressed global LV systolic function.   09/29/2015  : Please select correct template. Electronically Signed   By: Jolaine Click M.D.   On: 09/29/2015 13:26   ASSESSMENT AND PLAN 55 y/o male with h/o CAD s/p anterior STEMI 12/2013 treated with CABG x 5, Ischemic  cardiomyopathy, tobacco abuse, HTN, HLD, PUD and h/o noncompliance with office f/u, admitted for CP. 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 Chest Pain with High Risk for Cardiac Etiology: known coronary disease in a 55 y/o male heavy smoker with HTN. HLD and noncompliance with meds and clinic f/u. Recent symptomatolgy + high risk NST findings with inferolateral ischemia suggesting ischemia in the LCx territory. LHC showed severe 3 vessel ASCAD with patent LIMA to LAD, patent SVG to OM and occluded SVG to PLA/PDA and occluded SVG to OM. Medical Management recommended.   Continue ASA and decrease to  daily. Continue High intensity statin, BB and LA nitrate. Case management to assist with medication needs. HBA1C 6 and needs to be followed up by PCP.  2. ASCAD s/p CABG 2015 - continue ASA/statin/BB  3. Dyslipidemia on high dose statin - He has been off meds for about 1 year due to cost and LDL this admit was 160. Now on high dose statin.   4. Tobacco abuse - ongoing - smoking cessation counseling per nursing. He says that nicotine patch has never helped. Chantxi started.   5. HTN - BP controlled on BB - he had been off meds for a year due to cost.  Patient stable from cardiac standpoint for discharge home if BMET stable post cath.  Followup with PA in our office in 4 weeks.  Will sign off, call with any questions.    Quintella Reichert, MD  10/01/2015  8:39 AM

## 2015-10-01 NOTE — Progress Notes (Signed)
Completed D/C teaching including reviewed AVS printout including d/c appointments, resources to stop smoking, medications he has taken/ needs to take today, new prescriptions and care for cath site Lt wrist. All questions answered. Volunteers assisted patient and his mother via w/c to valet parking [employer] exit.

## 2015-10-04 LAB — CULTURE, BLOOD (ROUTINE X 2)
CULTURE: NO GROWTH
CULTURE: NO GROWTH

## 2015-10-26 NOTE — Progress Notes (Signed)
  ROS  This encounter was created in error - please disregard. 

## 2015-10-28 ENCOUNTER — Encounter: Payer: Self-pay | Admitting: Physician Assistant

## 2015-11-04 ENCOUNTER — Encounter: Payer: Self-pay | Admitting: Physician Assistant

## 2016-04-11 IMAGING — CR DG CHEST 1V PORT
1 series · 2 of 2 positions shown · non-contrast
Comparison: Chest radiograph September 08, 2012

CLINICAL DATA: Acute onset chest pain.

EXAM:
PORTABLE CHEST - 1 VIEW

[Series 1: AP · U · 2 of 2 slices shown]
[im 1/2]
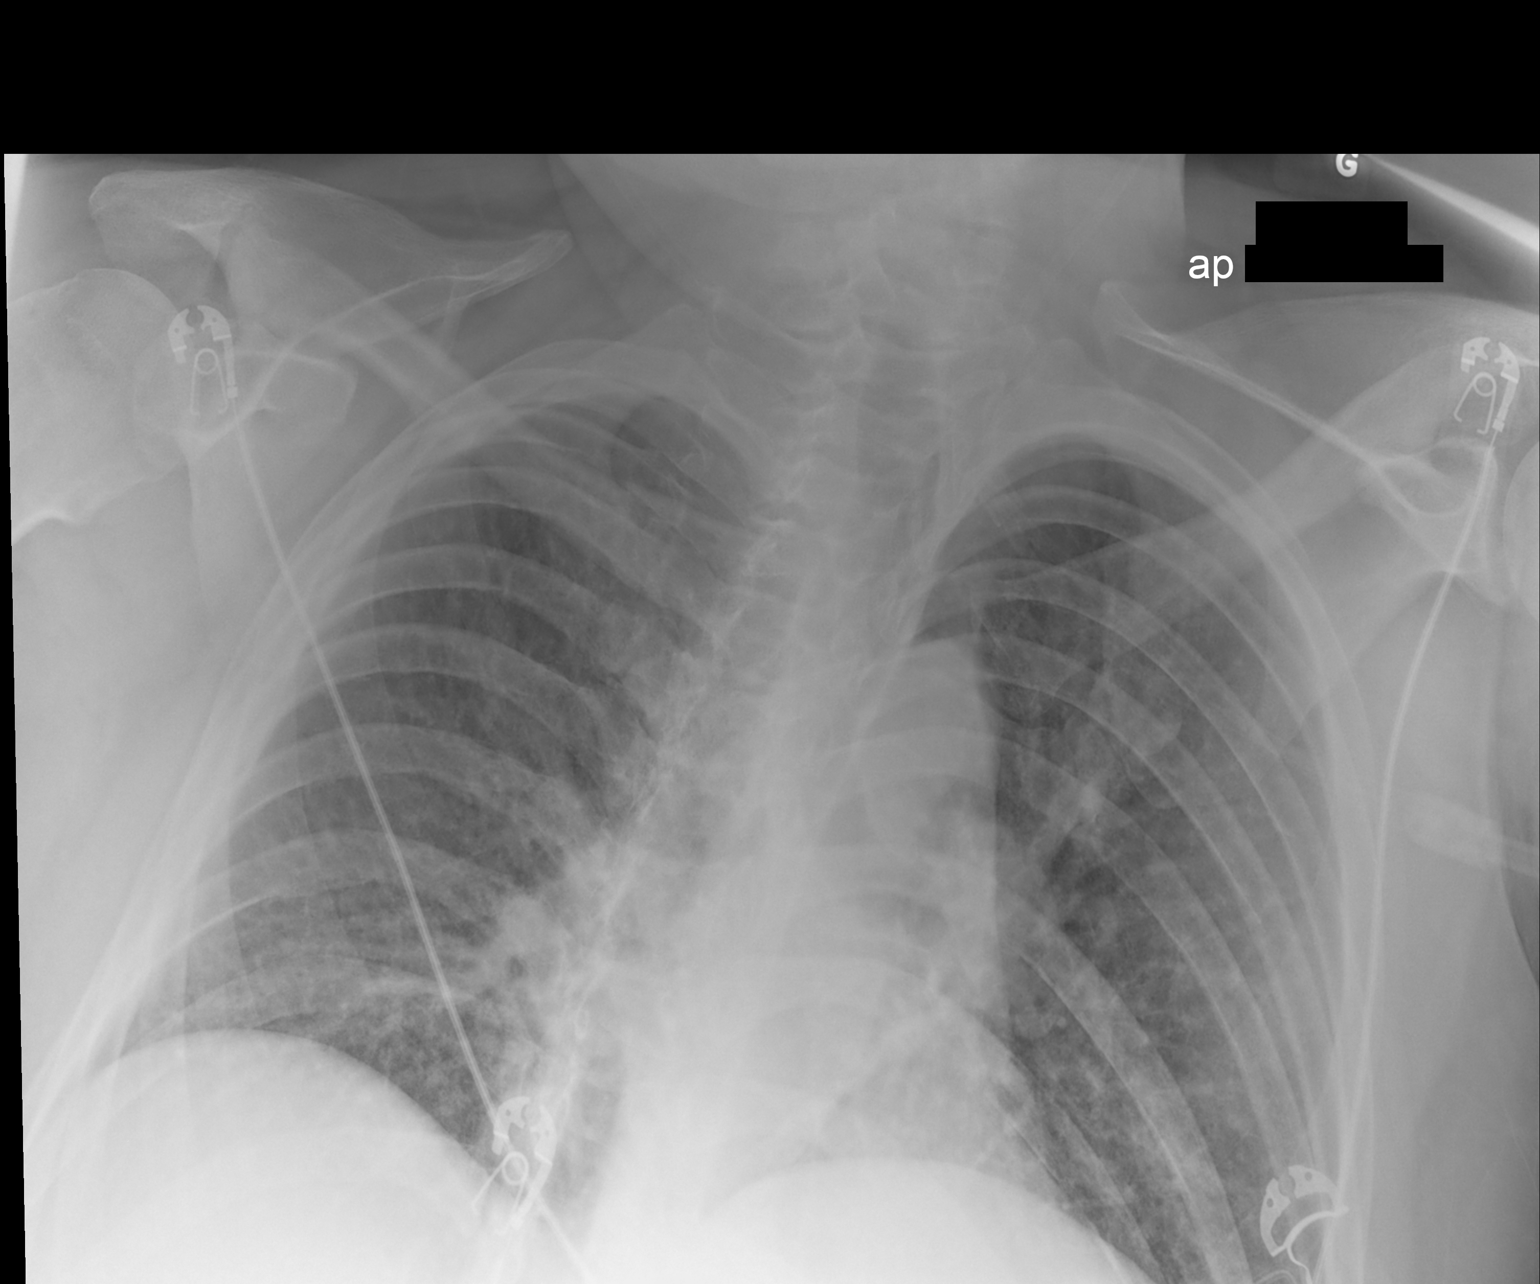
[im 2/2]
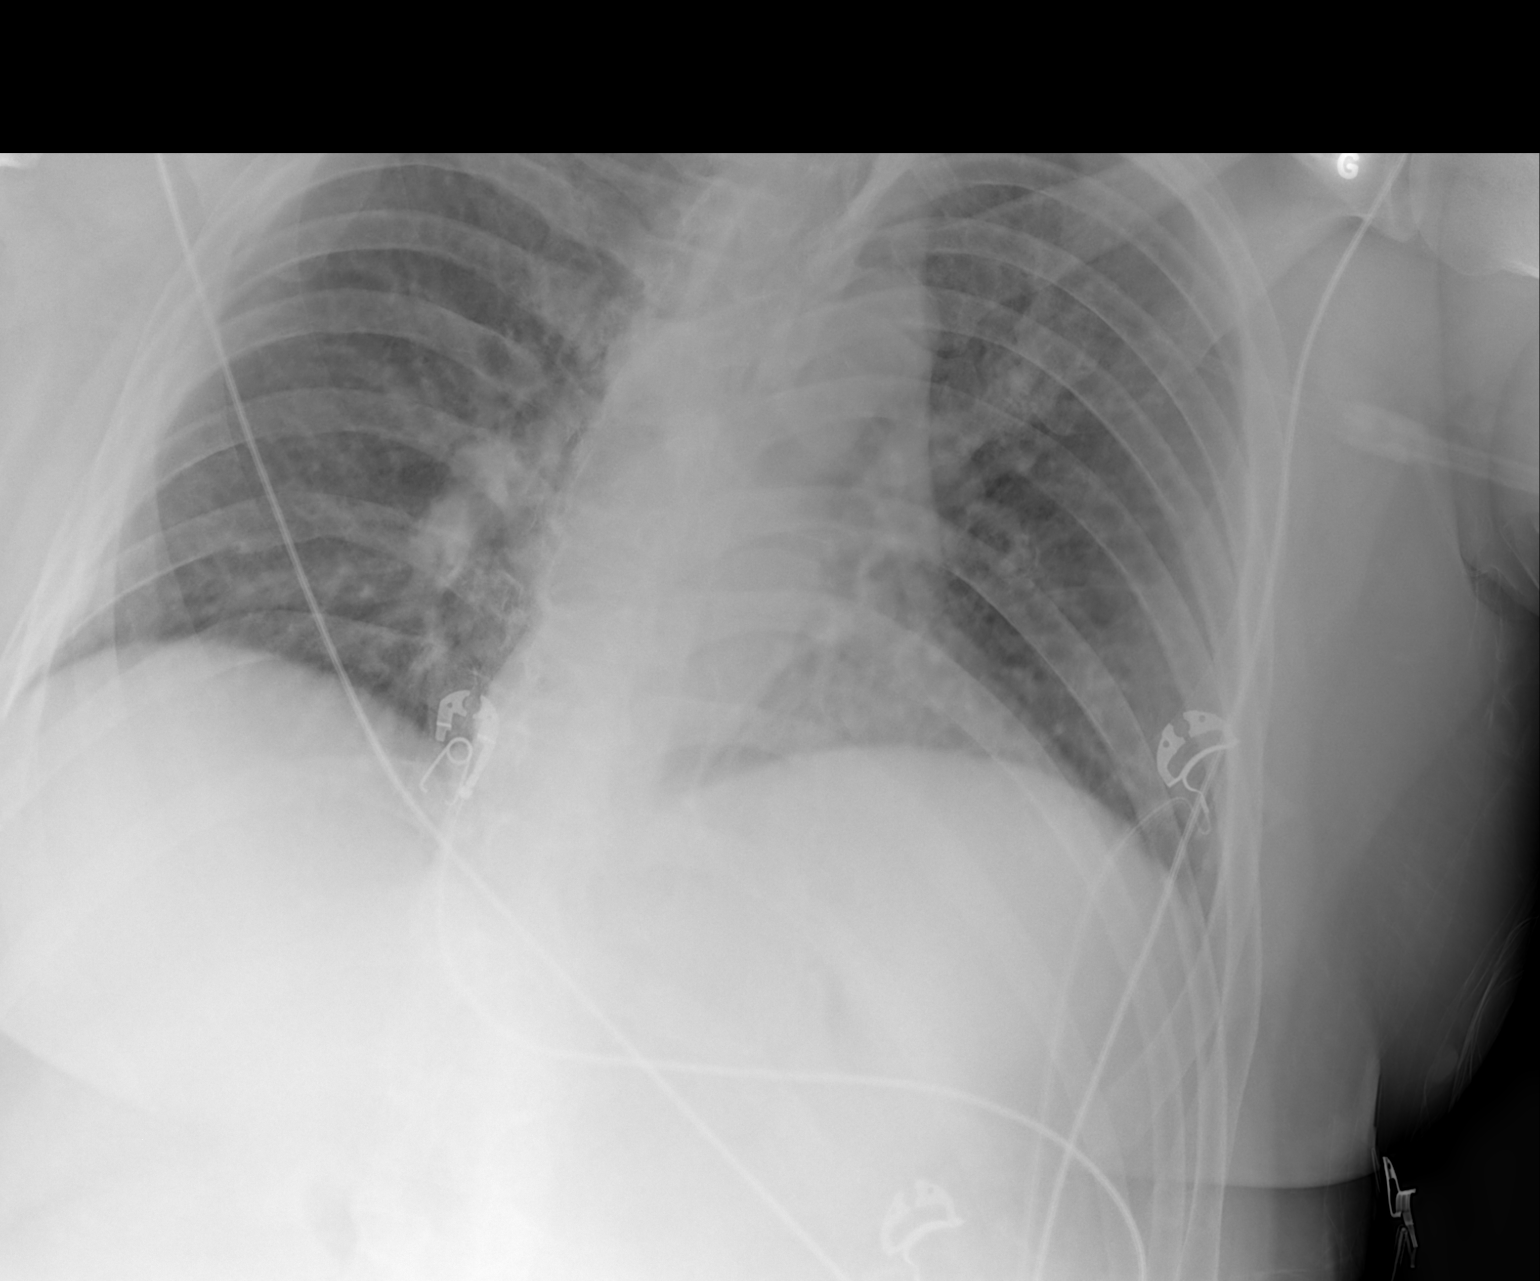

[2 of 2 positions shown; findings below may reference images not displayed]

FINDINGS: Cardiomediastinal silhouette is unremarkable for this low
inspiratory portable examination with crowded vasculature markings.
Central pulmonary vascular congestion. Similar mild chronic
interstitial changes. The lungs are clear without pleural effusions
or focal consolidations. Trachea projects midline and there is no
pneumothorax. Included soft tissue planes and osseous structures are
non-suspicious.
IMPRESSION: Central pulmonary vascular congestion superimposed on mild chronic
interstitial changes.

  By: Rhodri Liboiron

## 2016-04-12 IMAGING — CR DG CHEST 1V PORT
1 series · 1 of 1 positions shown · non-contrast
Comparison: One day prior

CLINICAL DATA: Postop for CABG.

EXAM:
PORTABLE CHEST - 1 VIEW

[AP]
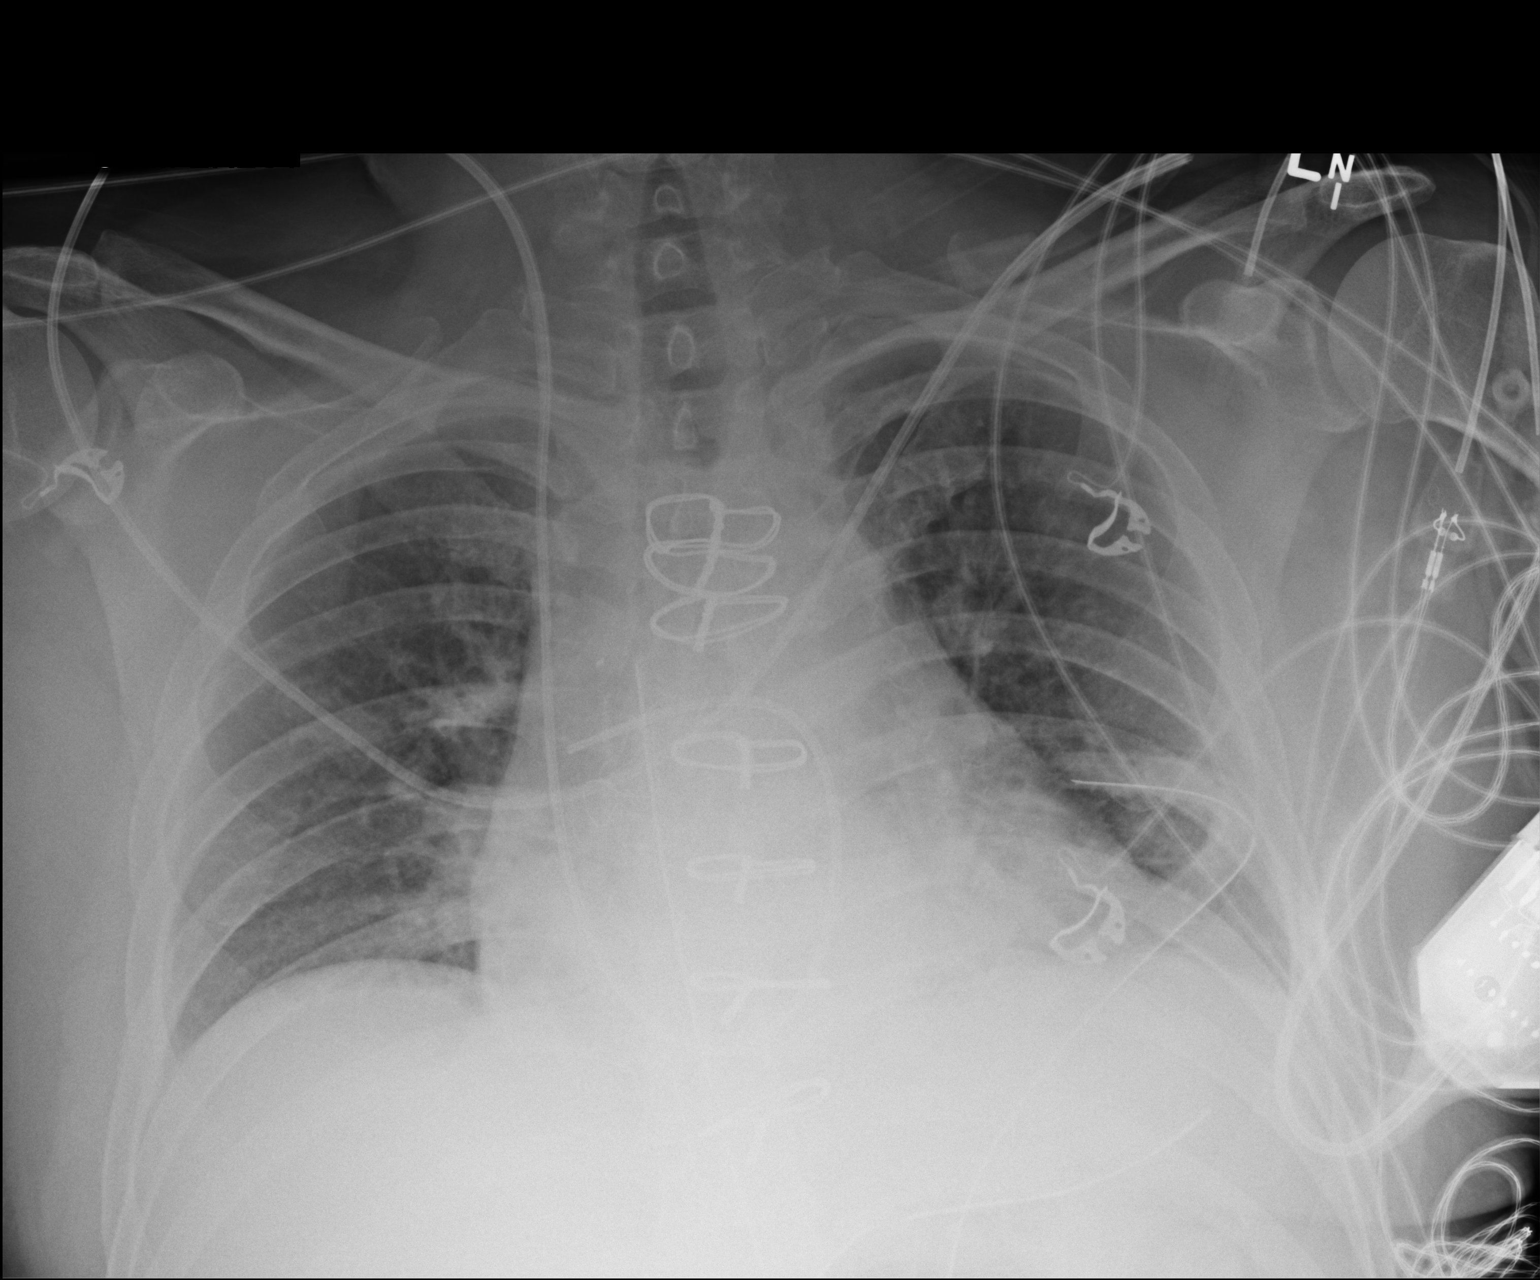

[1 of 1 positions shown; findings below may reference images not displayed]

FINDINGS: Extubation removal of nasogastric tube. Right IJ Swan-Ganz catheter
with tip at right pulmonary artery. Mediastinal drain. Two left
chest tubes. Prior median sternotomy.

Cardiomegaly accentuated by AP portable technique. No pleural
effusion or pneumothorax. Low lung volumes with resultant pulmonary
interstitial prominence. No congestive failure. Minimal bibasilar
atelectasis.
IMPRESSION: Expected appearance after extubation, with diminished lung volumes
and development of bibasilar atelectasis.

Cardiomegaly without congestive failure.

Other support apparatus stable in position, without pneumothorax.

## 2016-04-13 IMAGING — CR DG CHEST 1V PORT
1 series · 1 of 1 positions shown · non-contrast
Comparison: 12/19/2013

CLINICAL DATA: Postop cardiac surgery.

EXAM:
PORTABLE CHEST - 1 VIEW

[AP]
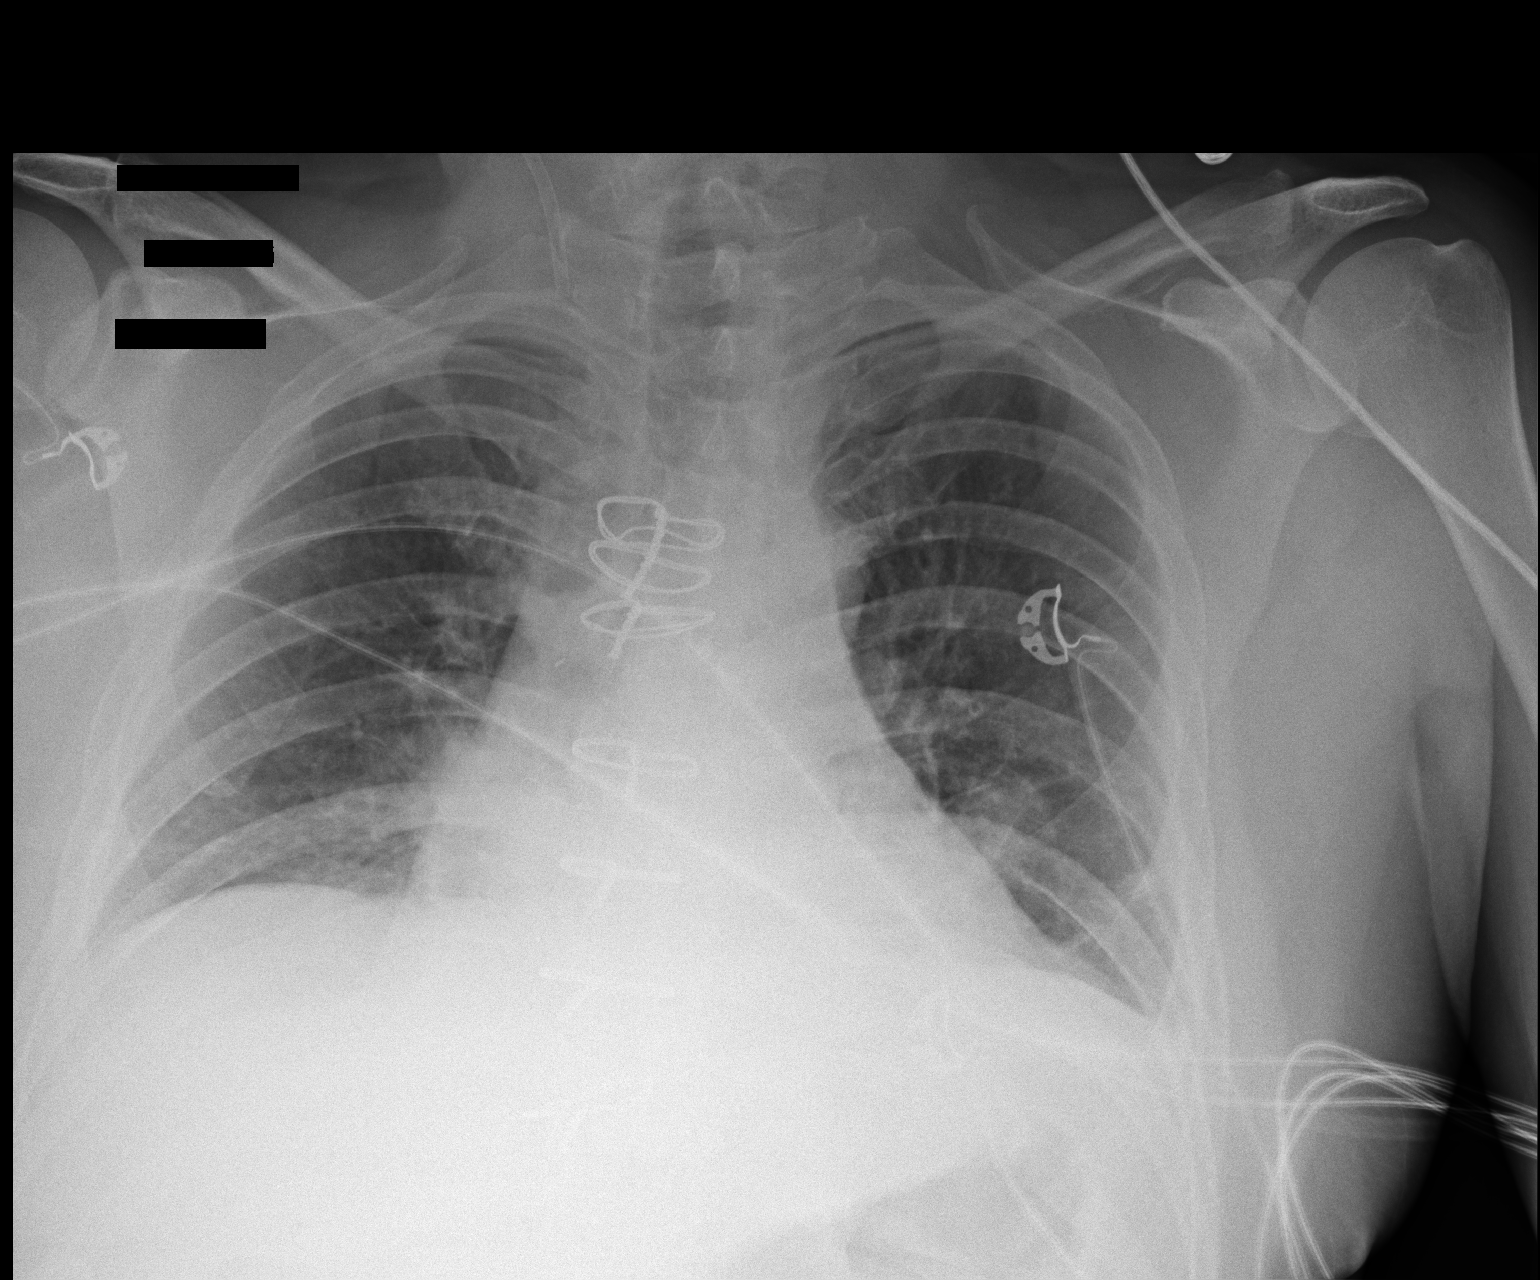

[1 of 1 positions shown; findings below may reference images not displayed]

FINDINGS: With the exception of the right internal jugular introducer sheath,
all lines and tubes have been removed. No pneumothorax or edema. No
mediastinal widening. Mild basilar atelectasis, similar to the prior
study.
IMPRESSION: No acute findings.  Mild persistent lung base atelectasis.

## 2017-05-02 ENCOUNTER — Ambulatory Visit (INDEPENDENT_AMBULATORY_CARE_PROVIDER_SITE_OTHER): Payer: Self-pay | Admitting: Cardiovascular Disease

## 2017-05-03 ENCOUNTER — Other Ambulatory Visit: Payer: Self-pay | Admitting: Cardiovascular Disease

## 2017-05-03 DIAGNOSIS — Z8249 Family history of ischemic heart disease and other diseases of the circulatory system: Secondary | ICD-10-CM

## 2017-05-04 ENCOUNTER — Other Ambulatory Visit: Payer: Self-pay | Admitting: Cardiovascular Disease

## 2017-05-04 DIAGNOSIS — Z8249 Family history of ischemic heart disease and other diseases of the circulatory system: Secondary | ICD-10-CM

## 2017-05-04 DIAGNOSIS — I10 Essential (primary) hypertension: Secondary | ICD-10-CM

## 2017-05-11 ENCOUNTER — Ambulatory Visit
Admission: RE | Admit: 2017-05-11 | Discharge: 2017-05-11 | Disposition: A | Discharge status: Home / Self Care | Payer: Commercial Managed Care - POS | Source: Ambulatory Visit | Attending: Cardiovascular Disease | Admitting: Cardiovascular Disease

## 2017-05-11 DIAGNOSIS — E782 Mixed hyperlipidemia: Secondary | ICD-10-CM | POA: Insufficient documentation

## 2017-05-11 DIAGNOSIS — R079 Chest pain, unspecified: Secondary | ICD-10-CM | POA: Insufficient documentation

## 2017-05-11 DIAGNOSIS — Z8249 Family history of ischemic heart disease and other diseases of the circulatory system: Secondary | ICD-10-CM

## 2017-05-16 ENCOUNTER — Ambulatory Visit: Payer: Commercial Managed Care - POS

## 2017-05-17 ENCOUNTER — Other Ambulatory Visit (INDEPENDENT_AMBULATORY_CARE_PROVIDER_SITE_OTHER): Payer: Self-pay

## 2017-05-17 ENCOUNTER — Inpatient Hospital Stay
Admission: RE | Admit: 2017-05-17 | Discharge: 2017-05-17 | Disposition: A | Discharge status: Home / Self Care | Payer: Commercial Managed Care - POS | Source: Ambulatory Visit | Attending: Cardiovascular Disease | Admitting: Cardiovascular Disease

## 2017-05-17 DIAGNOSIS — R079 Chest pain, unspecified: Secondary | ICD-10-CM | POA: Insufficient documentation

## 2017-05-17 DIAGNOSIS — I10 Essential (primary) hypertension: Secondary | ICD-10-CM | POA: Insufficient documentation

## 2017-05-17 DIAGNOSIS — Z8249 Family history of ischemic heart disease and other diseases of the circulatory system: Secondary | ICD-10-CM | POA: Insufficient documentation

## 2017-05-17 DIAGNOSIS — E782 Mixed hyperlipidemia: Secondary | ICD-10-CM | POA: Insufficient documentation

## 2017-05-17 DIAGNOSIS — I2510 Atherosclerotic heart disease of native coronary artery without angina pectoris: Secondary | ICD-10-CM | POA: Insufficient documentation

## 2017-05-17 DIAGNOSIS — R9431 Abnormal electrocardiogram [ECG] [EKG]: Secondary | ICD-10-CM | POA: Insufficient documentation

## 2017-05-17 LAB — VAHRT HISTORIC LVEF: Ejection Fraction: 63 %

## 2017-05-17 MED ORDER — TECHNETIUM TC 99M TETROFOSMIN INJECTION
1.00 | Freq: Once | Status: AC | PRN
Start: 2017-05-17 — End: 2017-05-17
  Administered 2017-05-17: 09:00:00 1 via INTRAVENOUS
  Filled 2017-05-17: qty 1

## 2017-05-17 MED ORDER — TECHNETIUM TC 99M TETROFOSMIN INJECTION
1.0000 | Freq: Once | Status: AC | PRN
Start: 2017-05-17 — End: 2017-05-17
  Administered 2017-05-17: 1 via INTRAVENOUS
  Filled 2017-05-17: qty 1

## 2017-05-22 ENCOUNTER — Ambulatory Visit
Admission: RE | Admit: 2017-05-22 | Discharge: 2017-05-22 | Disposition: A | Discharge status: Home / Self Care | Payer: Commercial Managed Care - POS | Source: Ambulatory Visit | Attending: Cardiovascular Disease | Admitting: Cardiovascular Disease

## 2017-10-20 ENCOUNTER — Emergency Department (HOSPITAL_COMMUNITY): Payer: Self-pay

## 2017-10-20 ENCOUNTER — Emergency Department (HOSPITAL_COMMUNITY)
Admission: EM | Admit: 2017-10-20 | Discharge: 2017-10-21 | Disposition: A | Discharge status: Home / Self Care | Payer: Self-pay | Attending: Emergency Medicine | Admitting: Emergency Medicine

## 2017-10-20 ENCOUNTER — Other Ambulatory Visit: Payer: Self-pay

## 2017-10-20 ENCOUNTER — Encounter (HOSPITAL_COMMUNITY): Payer: Self-pay

## 2017-10-20 DIAGNOSIS — I2510 Atherosclerotic heart disease of native coronary artery without angina pectoris: Secondary | ICD-10-CM | POA: Insufficient documentation

## 2017-10-20 DIAGNOSIS — Z8673 Personal history of transient ischemic attack (TIA), and cerebral infarction without residual deficits: Secondary | ICD-10-CM

## 2017-10-20 DIAGNOSIS — R29898 Other symptoms and signs involving the musculoskeletal system: Secondary | ICD-10-CM

## 2017-10-20 DIAGNOSIS — I10 Essential (primary) hypertension: Secondary | ICD-10-CM | POA: Insufficient documentation

## 2017-10-20 DIAGNOSIS — Z951 Presence of aortocoronary bypass graft: Secondary | ICD-10-CM | POA: Insufficient documentation

## 2017-10-20 DIAGNOSIS — Z79899 Other long term (current) drug therapy: Secondary | ICD-10-CM | POA: Insufficient documentation

## 2017-10-20 DIAGNOSIS — E785 Hyperlipidemia, unspecified: Secondary | ICD-10-CM | POA: Insufficient documentation

## 2017-10-20 DIAGNOSIS — F17210 Nicotine dependence, cigarettes, uncomplicated: Secondary | ICD-10-CM | POA: Insufficient documentation

## 2017-10-20 DIAGNOSIS — R55 Syncope and collapse: Secondary | ICD-10-CM | POA: Insufficient documentation

## 2017-10-20 DIAGNOSIS — I252 Old myocardial infarction: Secondary | ICD-10-CM | POA: Insufficient documentation

## 2017-10-20 DIAGNOSIS — J449 Chronic obstructive pulmonary disease, unspecified: Secondary | ICD-10-CM | POA: Insufficient documentation

## 2017-10-20 DIAGNOSIS — I255 Ischemic cardiomyopathy: Secondary | ICD-10-CM | POA: Insufficient documentation

## 2017-10-20 LAB — I-STAT TROPONIN, ED
TROPONIN I, POC: 0 ng/mL (ref 0.00–0.08)
Troponin i, poc: 0 ng/mL (ref 0.00–0.08)

## 2017-10-20 LAB — CBC WITH DIFFERENTIAL/PLATELET
Abs Immature Granulocytes: 0 10*3/uL (ref 0.0–0.1)
BASOS PCT: 1 %
Basophils Absolute: 0 10*3/uL (ref 0.0–0.1)
EOS ABS: 0.2 10*3/uL (ref 0.0–0.7)
EOS PCT: 3 %
HCT: 39.3 % (ref 39.0–52.0)
Hemoglobin: 12.9 g/dL — ABNORMAL LOW (ref 13.0–17.0)
Immature Granulocytes: 1 %
Lymphocytes Relative: 30 %
Lymphs Abs: 2.4 10*3/uL (ref 0.7–4.0)
MCH: 30.8 pg (ref 26.0–34.0)
MCHC: 32.8 g/dL (ref 30.0–36.0)
MCV: 93.8 fL (ref 78.0–100.0)
MONO ABS: 0.8 10*3/uL (ref 0.1–1.0)
Monocytes Relative: 9 %
Neutro Abs: 4.6 10*3/uL (ref 1.7–7.7)
Neutrophils Relative %: 56 %
PLATELETS: 218 10*3/uL (ref 150–400)
RBC: 4.19 MIL/uL — ABNORMAL LOW (ref 4.22–5.81)
RDW: 15.4 % (ref 11.5–15.5)
WBC: 8.1 10*3/uL (ref 4.0–10.5)

## 2017-10-20 LAB — COMPREHENSIVE METABOLIC PANEL
ALBUMIN: 3.8 g/dL (ref 3.5–5.0)
ALT: 40 U/L (ref 17–63)
AST: 33 U/L (ref 15–41)
Alkaline Phosphatase: 112 U/L (ref 38–126)
Anion gap: 9 (ref 5–15)
BUN: 17 mg/dL (ref 6–20)
CHLORIDE: 108 mmol/L (ref 101–111)
CO2: 23 mmol/L (ref 22–32)
Calcium: 9.1 mg/dL (ref 8.9–10.3)
Creatinine, Ser: 0.95 mg/dL (ref 0.61–1.24)
GFR calc Af Amer: >60 mL/min (ref >60)
GFR calc non Af Amer: >60 mL/min (ref >60)
Glucose, Bld: 93 mg/dL (ref 65–99)
POTASSIUM: 4.4 mmol/L (ref 3.5–5.1)
Sodium: 140 mmol/L (ref 135–145)
Total Bilirubin: 0.9 mg/dL (ref 0.3–1.2)
Total Protein: 7 g/dL (ref 6.5–8.1)

## 2017-10-20 LAB — URINALYSIS, ROUTINE W REFLEX MICROSCOPIC
Bilirubin Urine: NEGATIVE
Glucose, UA: NEGATIVE mg/dL
HGB URINE DIPSTICK: NEGATIVE
KETONES UR: NEGATIVE mg/dL
LEUKOCYTES UA: NEGATIVE
Nitrite: NEGATIVE
PROTEIN: NEGATIVE mg/dL
Specific Gravity, Urine: 1.021 (ref 1.005–1.030)
pH: 6 (ref 5.0–8.0)

## 2017-10-20 LAB — RAPID URINE DRUG SCREEN, HOSP PERFORMED
Amphetamines: NOT DETECTED
Barbiturates: NOT DETECTED
Benzodiazepines: NOT DETECTED
Cocaine: POSITIVE — AB
Opiates: NOT DETECTED
Tetrahydrocannabinol: NOT DETECTED

## 2017-10-20 LAB — PROTIME-INR
INR: 0.97
PROTHROMBIN TIME: 12.8 s (ref 11.4–15.2)

## 2017-10-20 LAB — APTT: aPTT: 31 seconds (ref 24–36)

## 2017-10-20 LAB — ETHANOL: Alcohol, Ethyl (B): <10 mg/dL (ref <10)

## 2017-10-20 LAB — VITAMIN B12: VITAMIN B 12: 214 pg/mL (ref 180–914)

## 2017-10-20 LAB — TSH: TSH: 1.262 u[IU]/mL (ref 0.350–4.500)

## 2017-10-20 LAB — CK: Total CK: 155 U/L (ref 49–397)

## 2017-10-20 MED ORDER — SODIUM CHLORIDE 0.9 % IV BOLUS
1000.0000 mL | Freq: Once | INTRAVENOUS | Status: AC
  Administered 2017-10-20: 1000 mL via INTRAVENOUS

## 2017-10-20 NOTE — ED Provider Notes (Signed)
[facility] EMERGENCY DEPARTMENT Provider Note   CSN: [id number] Arrival date & time: 10/20/17  1509     History   Chief Complaint Chief Complaint  Patient presents with  . Loss of Consciousness  . Fall    HPI Lee May is a 57 y.o. male with a history of STEMI's S/P CABG x5, COPD, heavy smoker 3 packs/day, who presents today for evaluation of upper right leg weakness and syncope.  He reports that he is homeless and has been outside for multiple days, it has been exceptionally warm recently.  He reports that his right thigh started feeling tingly, then it felt really really hot, then weak and he passed out.  He is unsure how long he was out for.  He denies any pains currently.  No chest pain or shortness of breath.  Denies headache, blurry vision.  Reports that he has had 2 bottles of water today however he has not urinated in the past few hours.  HPI  Past Medical History:  Diagnosis Date  . CAD (coronary artery disease), native coronary artery   . Cardiomyopathy, ischemic    resolved and EF now 55-60% by echo 03/2014  . COPD (chronic obstructive pulmonary disease) (HCC)   . Dyslipidemia, goal LDL below 70   . Essential hypertension   . Heavy smoker    3PPD  . MI (myocardial infarction) (HCC)   . Pneumonia   . PUD (peptic ulcer disease)   . 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 Laneta Simmers): LIMA-LAD, SVG-rAM, SeqSVG-RPDA-PL, SVG-OM  . ST elevation (STEMI) myocardial infarction involving left anterior descending coronary artery (HCC) 12/18/2013   Cath: early mLAD 100% after mod D1, mRCA 100% (L-R collaterals), Om1 80% ostial --> PTCA of LAD --> CABG x 5 ; Echo: EF 35-40% - hypokinesis of inferior, inferoseptal and distal posterior walls; apical dyskinesis    Patient Active Problem List   Diagnosis Date Noted  . History of stroke 10/21/2017  . Abnormal nuclear stress test 09/30/2015  . Cellulitis of right axilla   . Chest pain 09/29/2015  . Cellulitis  09/29/2015  . Chest pain with high risk for cardiac etiology 09/29/2015  . Coronary artery disease involving native coronary artery of native heart with unstable angina pectoris (HCC) 09/29/2015  . Cardiomyopathy, ischemic   . Atherosclerosis of native coronary artery of native heart without angina pectoris 01/15/2014  . PUD (peptic ulcer disease) 01/15/2014  . Nausea 01/02/2014  . Nausea alone 01/01/2014  . Bloating symptom 12/25/2013  . Heavy smoker   . Essential hypertension   . Dyslipidemia, goal LDL below 70   . S/P CABG x 5 on 12/19/13 12/18/2013    Past Surgical History:  Procedure Laterality Date  . CARDIAC CATHETERIZATION N/A 09/30/2015   Procedure: Left Heart Cath and Coronary Angiography;  Surgeon: Corky Crafts, MD;  Location: [facility] INVASIVE CV LAB;  Service: Cardiovascular;  Laterality: N/A;  . CARDIAC CATHETERIZATION  09/30/2015   Procedure: Bypass Graft Angiography;  Surgeon: Corky Crafts, MD;  Location: [facility] INVASIVE CV LAB;  Service: Cardiovascular;;  . CORONARY ANGIOPLASTY WITH STENT PLACEMENT    . CORONARY ARTERY BYPASS GRAFT N/A 12/18/2013   Procedure: CORONARY ARTERY BYPASS GRAFTING (CABG);  Surgeon: Alleen Borne, MD;  Location: [facility] OR;  Service: Open Heart Surgery;  Laterality: N/A;  . FRACTURE SURGERY    . INTRAOPERATIVE TRANSESOPHAGEAL ECHOCARDIOGRAM N/A 12/18/2013   Procedure: INTRAOPERATIVE TRANSESOPHAGEAL ECHOCARDIOGRAM;  Surgeon: Alleen Borne, MD;  Location:  [facility] OR;  Service: Open Heart Surgery;  Laterality: N/A;  . LEFT HEART CATHETERIZATION WITH CORONARY ANGIOGRAM N/A 12/18/2013   Procedure: LEFT HEART CATHETERIZATION WITH CORONARY ANGIOGRAM;  Surgeon: Marykay Lex, MD;  Location: [facility] CATH LAB;  Service: Cardiovascular;  Laterality: N/A;        Home Medications    Prior to Admission medications   Medication Sig Start Date End Date Taking? Authorizing Provider  albuterol (PROVENTIL HFA;VENTOLIN HFA) 108 (90 Base) MCG/ACT inhaler Inhale 2 puffs  into the lungs every 4 (four) hours as needed for wheezing or shortness of breath. 10/01/15  Yes Ghimire, Werner Lean, MD  atorvastatin (LIPITOR) 80 MG tablet Take 1 tablet (80 mg total) by mouth daily. 10/01/15  Yes Ghimire, Werner Lean, MD  isosorbide mononitrate (IMDUR) 30 MG 24 hr tablet Take 1 tablet (30 mg total) by mouth daily. 10/01/15  Yes Ghimire, Werner Lean, MD  metoprolol tartrate (LOPRESSOR) 25 MG tablet Take 1 tablet (25 mg total) by mouth 2 (two) times daily. 10/01/15  Yes Ghimire, Werner Lean, MD  aspirin EC 81 MG tablet Take 1 tablet (81 mg total) by mouth daily. Patient not taking: Reported on 10/20/2017 10/01/15   Maretta Bees, MD  nicotine (NICODERM CQ) 21 mg/24hr patch Place 1 patch (21 mg total) onto the skin daily. Patient not taking: Reported on 10/20/2017 10/01/15   Maretta Bees, MD  sulfamethoxazole-trimethoprim (BACTRIM DS,SEPTRA DS) 800-160 MG tablet Take 2 tablets by mouth every 12 (twelve) hours. Patient not taking: Reported on 10/20/2017 10/01/15   Maretta Bees, MD    Family History Family History  Problem Relation Age of Onset  . Cancer Father   . Hypertension Father   . Stroke Father   . Heart disease Mother   . Cancer Other   . CAD Other   . Heart attack Unknown     Social History Social History   Tobacco Use  . Smoking status: Current Every Day Smoker    Packs/day: 3.00    Years: 20.00    Pack years: 60.00    Types: Cigarettes  . Tobacco comment: states he quit smoking 12/17/13 but was seen smoking outside clinic  Substance Use Topics  . Alcohol use: No  . Drug use: No     Allergies   Patient has no known allergies.   Review of Systems Review of Systems  Constitutional: Negative for chills and fever.  HENT: Negative for ear pain and sore throat.   Eyes: Negative for pain and visual disturbance.  Respiratory: Negative for cough and shortness of breath.   Cardiovascular: Negative for chest pain and palpitations.  Gastrointestinal:  Negative for abdominal pain, diarrhea, nausea and vomiting.  Genitourinary: Positive for decreased urine volume. Negative for difficulty urinating, dysuria and hematuria.  Musculoskeletal: Negative for arthralgias, back pain and neck pain.  Skin: Negative for color change and rash.  Neurological: Positive for syncope and weakness (Right quad). Negative for seizures and headaches.  Psychiatric/Behavioral: Negative for confusion.  All other systems reviewed and are negative.    Physical Exam Updated Vital Signs BP 100/66   Pulse 68   Temp 98.7 F (37.1 C)   Resp (!) 25   SpO2 96%   Physical Exam  Constitutional: He is oriented to person, place, and time. He appears well-developed and well-nourished. No distress.  HENT:  Head: Normocephalic and atraumatic.  Mouth/Throat: Oropharynx is clear and moist.  Eyes: Pupils are equal, round, and reactive to light. Conjunctivae and EOM  are normal.  Neck: Normal range of motion. Neck supple.  No midline TTP.   Cardiovascular: Normal rate, regular rhythm, normal heart sounds and intact distal pulses.  No murmur heard. Pulmonary/Chest: Effort normal and breath sounds normal. No respiratory distress.  Abdominal: Soft. There is no tenderness.  Musculoskeletal: He exhibits no edema, tenderness or deformity.  C/T/L spine with out midline or lateral TTP, stepoffs or deformities.   Neurological: He is alert and oriented to person, place, and time.  Mental Status:  Alert, oriented, thought content appropriate, able to give a coherent history. Speech fluent without evidence of aphasia. Able to follow 2 step commands without difficulty.  Cranial Nerves:  II:  Peripheral visual fields grossly normal, pupils equal, round, reactive to light III,IV, VI: ptosis not present, extra-ocular motions intact bilaterally  V,VII: smile symmetric, facial light touch sensation equal VIII: hearing grossly normal to voice  X: uvula elevates symmetrically  XI:  bilateral shoulder shrug symmetric and strong XII: midline tongue extension without fassiculations Motor:  Normal tone. 5/5 in upperextremities bilaterally including strong and equal grip strength.  5/5 strength in bilateral lower extremities through ankle plantar flexion and dorsiflexion, knee flexion/extension, and hip flexion/extension.  With isolated straight leg raise he has 4/5 strength in the right leg which he attributes to quad weakness, 5/5 strength in the left leg. Cerebellar: normal finger-to-nose with bilateral upper extremities CV: distal pulses palpable throughout   Skin: Skin is warm and dry. He is not diaphoretic.  Psychiatric: He has a normal mood and affect.  Nursing note and vitals reviewed.    ED Treatments / Results  Labs (all labs ordered are listed, but only abnormal results are displayed) Labs Reviewed  RAPID URINE DRUG SCREEN, HOSP PERFORMED - Abnormal; Notable for the following components:      Result Value   Cocaine POSITIVE (*)    All other components within normal limits  CBC WITH DIFFERENTIAL/PLATELET - Abnormal; Notable for the following components:   RBC 4.19 (*)    Hemoglobin 12.9 (*)    All other components within normal limits  COMPREHENSIVE METABOLIC PANEL  ETHANOL  CK  URINALYSIS, ROUTINE W REFLEX MICROSCOPIC  PROTIME-INR  APTT  TSH  VITAMIN B12  CBC WITH DIFFERENTIAL/PLATELET  FOLATE RBC  I-STAT TROPONIN, ED  I-STAT TROPONIN, ED    EKG EKG Interpretation  Date/Time:  Saturday Oct 20 2017 15:17:50 EDT Ventricular Rate:  91 PR Interval:    QRS Duration: 100 QT Interval:  360 QTC Calculation: 443 R Axis:   17 Text Interpretation:  Sinus rhythm Borderline T wave abnormalities Confirmed by Vanetta Mulders [phone]) on 10/20/2017 3:21:53 PM   Radiology Ct Head Wo Contrast  Result Date: 10/20/2017 CLINICAL DATA:  Lower extremity numbness.  Fall.  No head trauma EXAM: CT HEAD WITHOUT CONTRAST TECHNIQUE: Contiguous axial images were  obtained from the base of the skull through the vertex without intravenous contrast. COMPARISON:  None. FINDINGS: Brain: Focal cortical hypodensity in the anterior RIGHT frontal lobe measuring approximately 2 x 2 cm (image 18, series 3). Additional deep white matter hypodensities in the RIGHT external capsule and basal ganglia. Subcortical white matter hypodensity in the LEFT frontal lobe. No extra-axial fluid collections. No parenchymal hemorrhage. Ventricles normal volume. Vascular: No hyperdense vessel or unexpected calcification. Skull: Normal. Negative for fracture or focal lesion. Sinuses/Orbits: No acute finding. Other: None IMPRESSION: 1. Relatively focal cortical infarction in the anterior RIGHT frontal lobe. Concern for acute infarction. 2. Deep white matter and basal ganglia  hypodensities on the RIGHT. 3. No intracranial hemorrhage or mass effect. Findings conveyed toELIZABETH HAMMOND on 10/20/2017  at16:55. Electronically Signed   By: Genevive Bi M.D.   On: 10/20/2017 16:54   Mr Brain Wo Contrast  Result Date: 10/20/2017 CLINICAL DATA:  Subacute neuro deficit.  Right leg weakness EXAM: MRI HEAD WITHOUT CONTRAST TECHNIQUE: Multiplanar, multiecho pulse sequences of the brain and surrounding structures were obtained without intravenous contrast. COMPARISON:  CT head 10/20/2017 FINDINGS: Brain: Negative for acute infarct. Chronic hemorrhagic infarct right frontal lobe corresponding to the CT abnormality. Mild chronic microvascular ischemic changes in the white matter. Negative for mass or edema. Ventricle size and cerebral volume normal Vascular: Normal arterial flow voids Skull and upper cervical spine: Negative Sinuses/Orbits: Mild mucosal edema paranasal sinuses. Normal orbit bilaterally Other: None IMPRESSION: Negative for acute infarct.  Chronic ischemic changes as above. Electronically Signed   By: Marlan Palau M.D.   On: 10/20/2017 20:01    Procedures Procedures (including critical care  time)  Medications Ordered in ED Medications  sodium chloride 0.9 % bolus 1,000 mL (0 mLs Intravenous Stopped 10/20/17 2110)     Initial Impression / Assessment and Plan / ED Course  I have reviewed the triage vital signs and the nursing notes.  Pertinent labs & imaging results that were available during my care of the patient were reviewed by me and considered in my medical decision making (see chart for details).  Clinical Course as of Oct 22 119  Sat Oct 20, 2017  1532 Cath from 4/27 with LVEF 55-65%   [EH]  1650 Spoke with radiologist.     [EH]  [phone] Paged neuro on-call.   [EH]  1701 Spoke with Dr. Wilford Corner who requested brain MRI with out contrast.  If abnormal will call back.  Given weakness is right sided and stroke is right sided does not make sense clinically.    [EH]    Clinical Course User Index [EH] Cristina Gong, PA-C   Patient presents today for evaluation after feeling like his right thigh was weak followed by syncope and collapse.  Labs were obtained and reviewed.  Very mild anemia, however in general is without significant electrolyte or hematologic abnormalities.  Based on history suspect dehydration, he was given 1 L IV fluids along with p.o. hydration after which she reported improvement.  CT head showed concern for stroke on the right side which does not clinically line up with the right thigh weakness.  I spoke with neurology on-call Dr. Wilford Corner who requested brain MRI with contrast, reports that if that does not show acute stroke that he may go home.  MRI showed that stroke is chronic in nature, most likely related to patient's reported previous TIAs.  She was able to ambulate in the hallway without ataxia, feeling off balance, or other difficulty.  He was observed in the emergency room for multiple hours without repeat syncopal episode.  Return precautions were discussed with patient who states his understanding.  He is given follow-up with both neurology and the  [facility] to establish care.  Patient was informed that he needs to make sure he is staying well-hydrated and avoid cocaine use.  Patient discharged home.  Final Clinical Impressions(s) / ED Diagnoses   Final diagnoses:  Syncope and collapse  History of stroke    ED Discharge Orders    None       Norman Clay 10/21/17 0121    Vanetta Mulders, MD 10/25/17 2004

## 2017-10-20 NOTE — Consult Note (Addendum)
Referring Physician: Vanetta Mulders, MD    Chief Complaint: Loss of Consciousness and Fall   HPI: Lee May is an 57 y.o. male with a past medical history of CAD with 2 MIs, status post 1 stent and 5 way bypass, long-term 2 to 3 pack/day tobacco use over the last 40 years, COPD history of TIA last on 4 months ago presents the ED with complaints of upper right leg weakness, subsequent bilateral lower extremity weakness and buckling and reports of syncope.  Per patient he has been homeless for over the last month and has been living outside for multiple days in the warm weather.  This afternoon around 11:00 he started walking the dog [address] [place] going somewhere and states walked about 3 hours when suddenly around 3 PM he felt numbness tingling and burning sensation to his right upper thigh.  Then suddenly felt both legs buckling under him he fell to the ground, he states he may have passed out for a few seconds when he hit the ground but unsure.  He denies any head trauma.  During that incident patient admits to having a mild headache likely from being in the sun, denies lightheadedness,  Muscle weakness, facial droop, dysarthria, confusion, back ache, nausea, vomiting, ocular pain, blurred vision, chest pain but admitted to shortness of breath with his baseline COPD.  Patient states he came to and called EMS who brought him to the hospital.  On admission symptoms has since resolved, but states symptoms of right upper thigh numbness and tingling has been persistent over the last 2 months and often presents when he lays on his back with  occasional tingling in his hands.  On admission initial CT of the head showed a relatively focal cortical infarct on the anterior right frontal lobe concerning for infarction,  otherwise no other intracranial hemorrhage or mass.  He is awake alert oriented x3 with no focal neuro.  He admits to a history of a stroke/TIA on the right side of his brain 3  -4 months ago with dysarthria and left-sided weakness at that time which has since resolved.  He was seen at [facility] in [facility]. he admits to feeling dehydrated and has only drink about 2 water bottles of water today and had not urinated for several hours prior to presenting to the hospital.  Admits to heavy tobacco use and cocaine drug abuse and alcohol abuse for long period of time many years ago.  Also on admission all labs were within normal limits, negative alcohol levels, EKG normal sinus rhythm with a T wave abnormality.  LSN: 10/20/17 [id number] tPA Given: No: No LVO Premorbid modified Rankin scale (mRS): 1  Past Medical History:  Diagnosis Date  . CAD (coronary artery disease), native coronary artery   . Cardiomyopathy, ischemic    resolved and EF now 55-60% by echo 03/2014  . COPD (chronic obstructive pulmonary disease) (HCC)   . Dyslipidemia, goal LDL below 70   . Essential hypertension   . Heavy smoker    3PPD  . MI (myocardial infarction) (HCC)   . Pneumonia   . PUD (peptic ulcer disease)   . 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 Laneta Simmers): LIMA-LAD, SVG-rAM, SeqSVG-RPDA-PL, SVG-OM  . ST elevation (STEMI) myocardial infarction involving left anterior descending coronary artery (HCC) 12/18/2013   Cath: early mLAD 100% after mod D1, mRCA 100% (L-R collaterals), Om1 80% ostial --> PTCA of LAD --> CABG x 5 ; Echo: EF 35-40% - hypokinesis of  inferior, inferoseptal and distal posterior walls; apical dyskinesis    Past Surgical History:  Procedure Laterality Date  . CARDIAC CATHETERIZATION N/A 09/30/2015   Procedure: Left Heart Cath and Coronary Angiography;  Surgeon: Corky Crafts, MD;  Location: [facility] INVASIVE CV LAB;  Service: Cardiovascular;  Laterality: N/A;  . CARDIAC CATHETERIZATION  09/30/2015   Procedure: Bypass Graft Angiography;  Surgeon: Corky Crafts, MD;  Location: [facility] INVASIVE CV LAB;  Service: Cardiovascular;;  . CORONARY ANGIOPLASTY WITH STENT PLACEMENT    . CORONARY ARTERY  BYPASS GRAFT N/A 12/18/2013   Procedure: CORONARY ARTERY BYPASS GRAFTING (CABG);  Surgeon: Alleen Borne, MD;  Location: [facility] OR;  Service: Open Heart Surgery;  Laterality: N/A;  . FRACTURE SURGERY    . INTRAOPERATIVE TRANSESOPHAGEAL ECHOCARDIOGRAM N/A 12/18/2013   Procedure: INTRAOPERATIVE TRANSESOPHAGEAL ECHOCARDIOGRAM;  Surgeon: Alleen Borne, MD;  Location: [facility] OR;  Service: Open Heart Surgery;  Laterality: N/A;  . LEFT HEART CATHETERIZATION WITH CORONARY ANGIOGRAM N/A 12/18/2013   Procedure: LEFT HEART CATHETERIZATION WITH CORONARY ANGIOGRAM;  Surgeon: Marykay Lex, MD;  Location: [facility] CATH LAB;  Service: Cardiovascular;  Laterality: N/A;    Family History  Problem Relation Age of Onset  . Cancer Father   . Hypertension Father   . Stroke Father   . Heart disease Mother   . Cancer Other   . CAD Other   . Heart attack Unknown    Social History:  reports that he has been smoking cigarettes.  He has a 60.00 pack-year smoking history. He does not have any smokeless tobacco history on file. He reports that he does not drink alcohol or use drugs at that time but admits to a previous history of alcohol and drug abuse many years ago.   Allergies: No Known Allergies  Medications:  No current facility-administered medications for this encounter.   Current Outpatient Medications:  .  albuterol (PROVENTIL HFA;VENTOLIN HFA) 108 (90 Base) MCG/ACT inhaler, Inhale 2 puffs into the lungs every 4 (four) hours as needed for wheezing or shortness of breath., Disp: 1 Inhaler, Rfl: 0 .  atorvastatin (LIPITOR) 80 MG tablet, Take 1 tablet (80 mg total) by mouth daily., Disp: 30 tablet, Rfl: 0 .  isosorbide mononitrate (IMDUR) 30 MG 24 hr tablet, Take 1 tablet (30 mg total) by mouth daily., Disp: 30 tablet, Rfl: 0 .  metoprolol tartrate (LOPRESSOR) 25 MG tablet, Take 1 tablet (25 mg total) by mouth 2 (two) times daily., Disp: 60 tablet, Rfl: 0 .  aspirin EC 81 MG tablet, Take 1 tablet (81 mg total) by mouth  daily. (Patient not taking: Reported on 10/20/2017), Disp: , Rfl:  .  nicotine (NICODERM CQ) 21 mg/24hr patch, Place 1 patch (21 mg total) onto the skin daily. (Patient not taking: Reported on 10/20/2017), Disp: 28 patch, Rfl: 0 .  sulfamethoxazole-trimethoprim (BACTRIM DS,SEPTRA DS) 800-160 MG tablet, Take 2 tablets by mouth every 12 (twelve) hours. (Patient not taking: Reported on 10/20/2017), Disp: 10 tablet, Rfl: 0  ROS: Constitutional: Negative for chills and fever.  HENT: Negative for ear pain and sore throat.   Eyes: Negative for pain and visual disturbance.  Respiratory: Negative for cough, but admits to shortness of breath.   Cardiovascular: Negative for chest pain and palpitations.  Gastrointestinal: Negative for abdominal pain, diarrhea, nausea and vomiting.  Genitourinary: Positive for decreased urine volume after not drinking much fluid today. Negative for difficulty urinating, dysuria and hematuria.  Musculoskeletal: Negative for arthralgias, back pain and neck pain.  Skin:  Negative for color change and rash.  Neurological: Positive for weakness in BLE, and ?syncope Negative for seizures and headaches.  Psychiatric/Behavioral: Negative for confusion.  All other systems reviewed and are negative   Physical Examination: Blood pressure 106/64, pulse 71, temperature 98.7 F (37.1 C), temperature source Oral, resp. rate (!) 0, SpO2 97 %. HEENT-  Normocephalic, no lesions, without obvious abnormality.  Normal external eye and conjunctiva.   Cardiovascular- S1-S2 audible, pulses palpable throughout   Lungs-no rhonchi or wheezing noted, no increased work of breathing.   Abdomen- All 4 quadrants palpated and nontender Musculoskeletal-no joint tenderness or edema Skin-warm and dry  Neurological Examination Mental Status: Alert, oriented, thought content appropriate.  Speech fluent without evidence of aphasia.  Able to follow 3 step commands without difficulty. Cranial Nerves: II:  Visual fields grossly normal,  III,IV, VI: ptosis not present, extra-ocular motions intact bilaterally pupils equal, round, reactive to light and accommodation V,VII: smile symmetric, facial light touch sensation normal bilaterally VIII: Hearing intact to voice IX,X: uvula rises symmetrically XI: bilateral shoulder shrug XII: midline tongue extension Motor: Right : Upper extremity   5/5    Left:     Upper extremity   5/5  Lower extremity   5/5     Lower extremity   5/5 Tone and bulk:normal tone throughout; no atrophy noted Sensory: Vibratory sense intact bilaterally and minimally decreased temperature sensation on right foot.  No sensory loss to pinprick in fingertips  Deep Tendon Reflexes: 2+ and symmetric throughout Plantars: Right: downgoing   Left: downgoing Cerebellar: normal finger-to-nose, normal rapid alternating movements and normal heel-to-shin test Gait: Not assessed but normal per his own report, as he walked 3 hours today without a problem and also came to the ER without any issue.  He has been using the bathroom beside his room and is able to walk without problems  NIHSS-1  Ct Head Wo Contrast 10/20/2017 IMPRESSION: 1. Relatively focal cortical infarction in the anterior RIGHT frontal lobe. Concern for acute infarction. 2. Deep white matter and basal ganglia hypodensities on the RIGHT. 3. No intracranial hemorrhage or mass effect.   Assessment: 57 y.o. male with a pmhx of CAD with 2 MIs, status post 1 stent and 5 way bypass, long-term 2 to 3 pack/day tobacco use over the last 40 years, COPD history of TIA last on 4 months ago presents the ED with complaints of upper right leg weakness, subsequent bilateral lower extremity weakness and buckling and reports of syncope. Stroke Risk Factors - family history, hyperlipidemia, hypertension and smoking  1.  Bilateral lower extremity weakness, differential diagnosis neuropathy vs stroke in patient who presents with dehydration  after 3 hours of working in the sun.  Neurology was consulted for possible stroke.  CT of the head showed a focal cortical infarct in the anterior right frontal lobe which does not correlate with patient's symptoms.  Patient does have a history of a previous TIA/stroke about 4 months ago with associated left-sided weakness which has since resolved but due to patient having a stroke at such a young age, with concerning findings on CT, it is prudent to pursue MRI of the brain to actually rule out a stroke before initiating full stroke work-up.  For neuropathy work-up we will check TSH and vitamin B12. 2.  Dehydration  3.  CAD 4.  COPD patient with tobacco abuse 5.  Tobacco abuse  plan: -Check TSH, vitamin B12 levels asap - MRI of the brain without contrast, if MRI negative  patient can be discharged if no further comorbidities. -Tobacco cessation discussed with patient, verbalized understanding  Lee Ewing DNP Neuro-hospitalist Team [phone] 10/20/2017, 5:40 PM   Attending Neurohospitalist Addendum Patient seen and examined with APP/Resident. Agree with the history and physical as documented above. Agree with the plan as documented, which I helped formulate. I have independently reviewed the chart, obtained history, review of systems and examined the patient.I have personally reviewed pertinent head/neck/spine imaging (CT/MRI). Head CT scan shows a focal area of hypodensity in the right frontal lobe, age-indeterminate (based on history, might be chronic). Given the bad cardiovascular history and cerebrovascular history with MI and strokes at young age, I would recommend getting an MRI of the brain to rule out any acute stroke.  His story although sounds more like dehydration and generalized weakness (involving both legs), stroke should definitely be ruled out.. If the MRI of the brain is negative for stroke, he can be discharged home and follow-up with outpatient neurology for  neuropathy/cramps. Check B12, TSH. If the MRI of the brain is positive for stroke, he needs admission for stroke work-up Please call on-call neurologist for recommendations of the MRI of the brain is positive. Pt was counseled on smoking cessations and avoidance of illicit drugs Please feel free to call with any questions. --- Milon Dikes, MD [facility] Pager: [phone]  If 7pm to 7am, please call on call as listed on AMION.

## 2017-10-20 NOTE — ED Triage Notes (Signed)
Per EMS patient has been walking today and felt upper leg weakness and, per patient, fell and had an episode of LOC. Patient states no pain at this time.

## 2017-10-20 NOTE — ED Notes (Signed)
Pt ambulated in hall with steady gait, no complaints of dizziness.

## 2017-10-21 DIAGNOSIS — Z8673 Personal history of transient ischemic attack (TIA), and cerebral infarction without residual deficits: Secondary | ICD-10-CM

## 2017-10-21 NOTE — Discharge Instructions (Signed)
Please make sure that you are staying well-hydrated.  Today your CT and MRI both showed that at some point in the past you have had a stroke, however this is not related to your symptoms today.  I have given you follow-up with both neurology and the [facility].  Please make sure that you follow-up with them.  You may need to be on medications to help prevent future strokes.

## 2017-10-22 LAB — FOLATE RBC
FOLATE, HEMOLYSATE: 407.8 ng/mL
Folate, RBC: 973 ng/mL (ref >498)
Hematocrit: 41.9 % (ref 37.5–51.0)

## 2018-01-21 IMAGING — CR DG CHEST 2V
2 series · 2 of 2 positions shown · non-contrast
Comparison: 03/22/2014

CLINICAL DATA: Chest pain and shortness of breath

EXAM:
CHEST  2 VIEW

[chest pa]
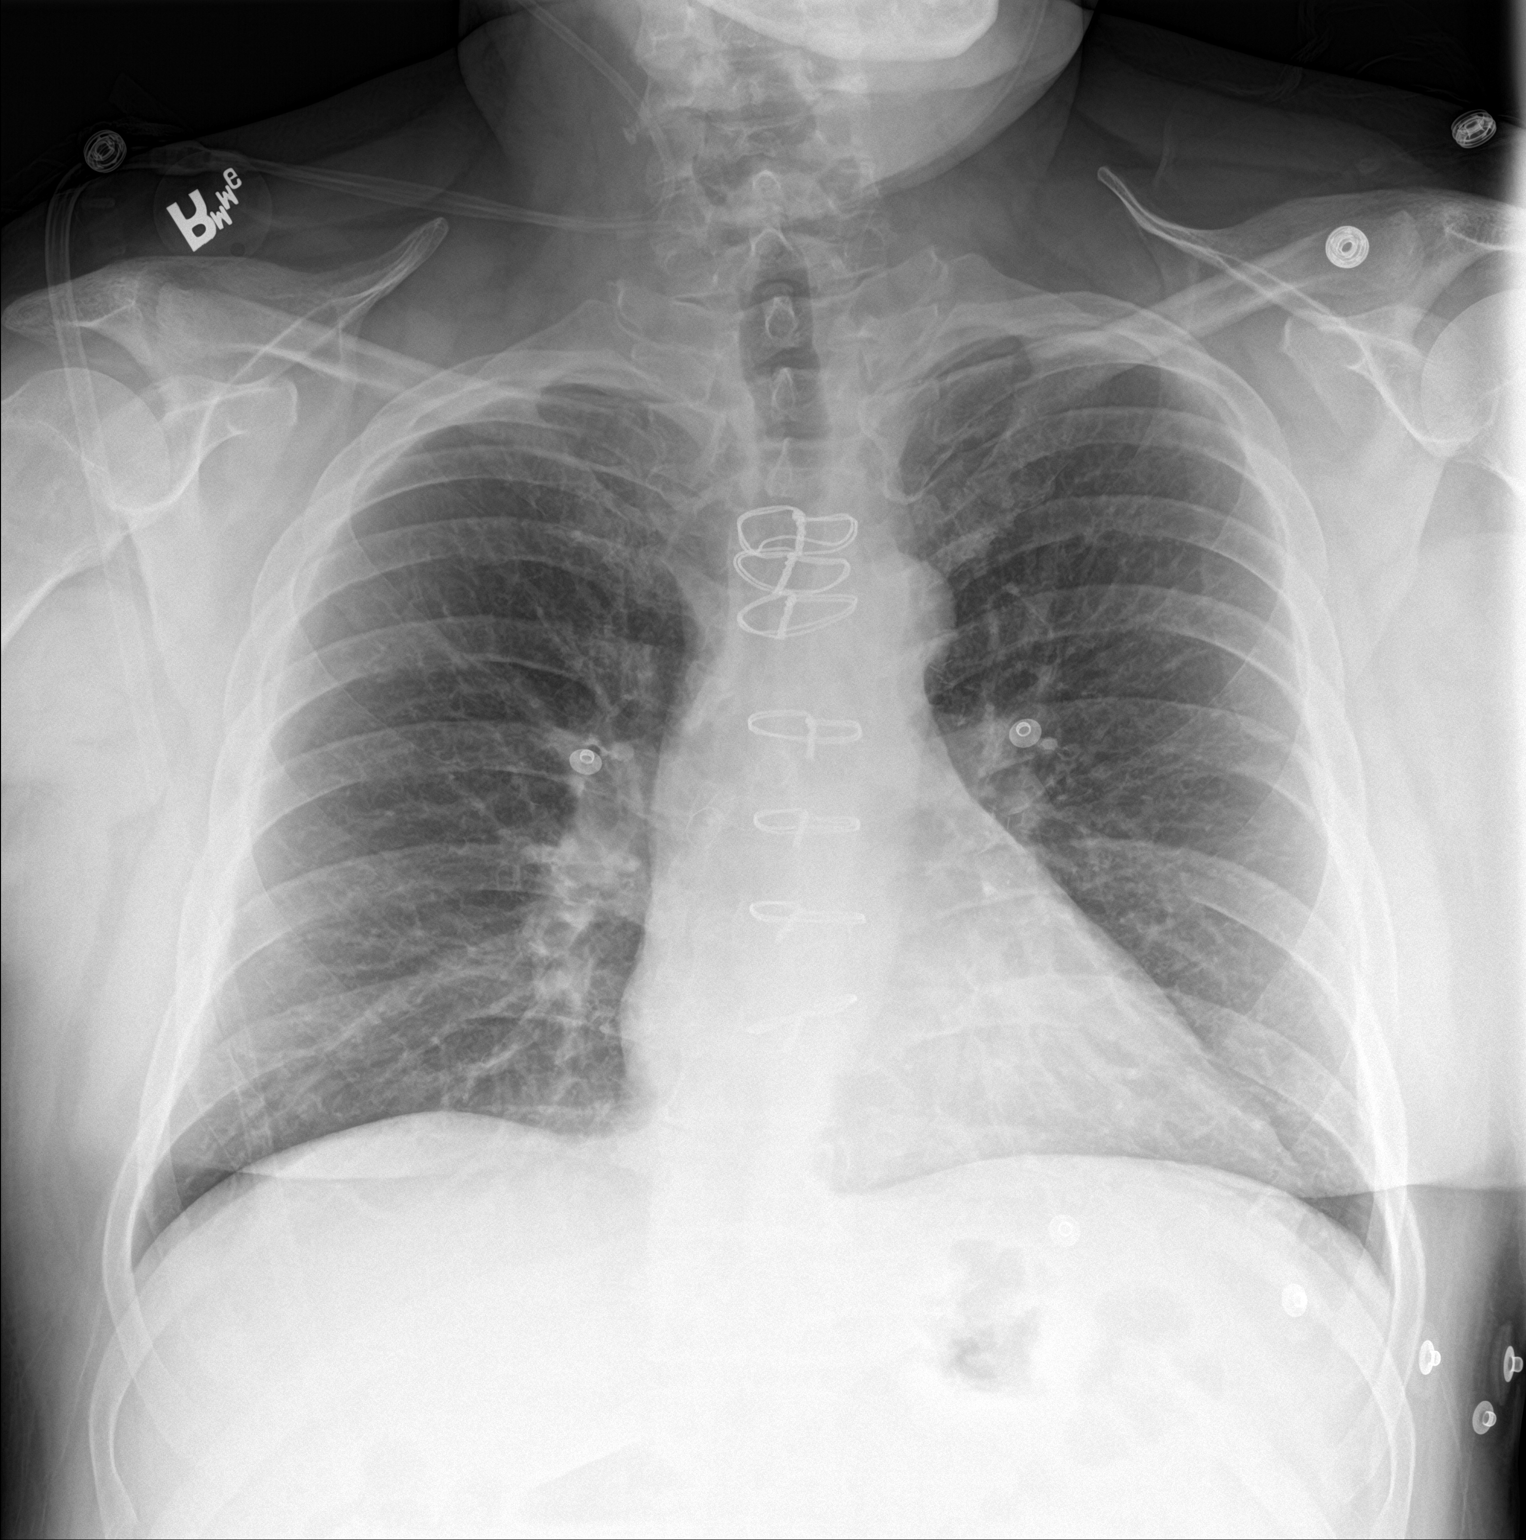

[chest lat]
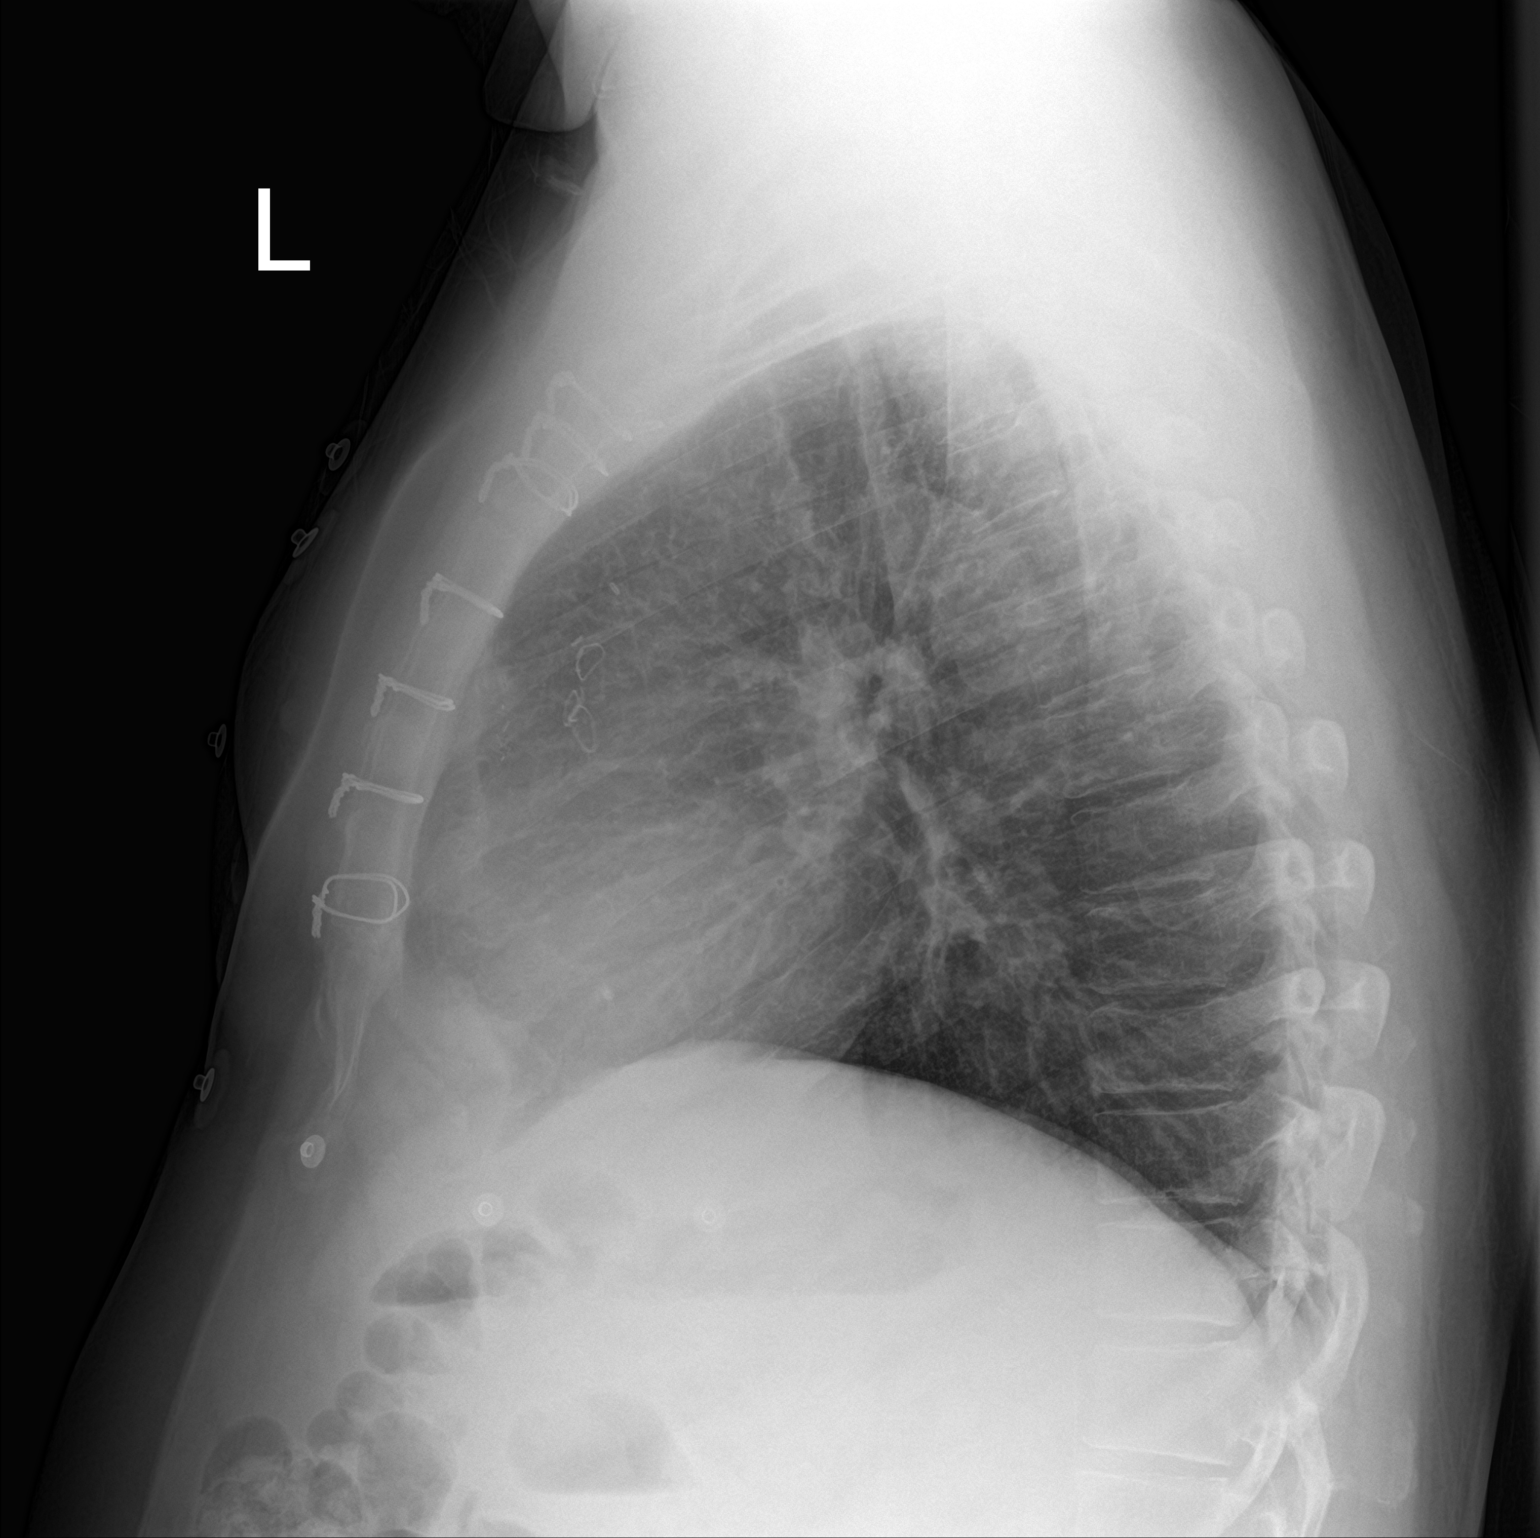

[2 of 2 positions shown; findings below may reference images not displayed]

FINDINGS: Normal heart size and stable mediastinal contours. Post CABG. There
is no edema, consolidation, effusion, or pneumothorax. No acute
osseous finding.
IMPRESSION: No evidence of active disease.

## 2020-09-06 NOTE — H&P (Signed)
[facility] OFFICE  [address] [place], [place] [address]     Elayne Guerin    Date of Visit:  05/02/2017  Date of Birth: 06/08/1960  Age: 60 yrs.   Medical Record Number: [id number]  Referring Physician: Dyanne Carrel MD, Maisie Fus  __   CURRENT DIAGNOSES     1. Abnormal electrocardiogram, 794.31  2. Hyperlipidemia, mixed, E78.2  3. Chest pain, unspecified, R07.9  4. Family history of ischemic heart disease and other diseases  of the circulatory system, Z82.49  5. Cardiovascular disease, unspecified, 429.2  __  ALLERGIES     No Known Drug Allergies  __  MEDICATIONS     1. Lipitor 10 mg tablet, 1 po qd  __   CHIEF COMPLAINT/REASON FOR VISIT  Cardiac risk factors  __  HISTORY OF PRESENT ILLNESS  Mr.  Rozelle is a 60 year old male with a strong family history of very premature atherosclerosis in his brother who is self-referred to reestablish cardiovascular care in the context of concern about his family history. He has an additional risk factor of  dyslipidemia which is managed with atorvastatin 10 mg daily and a recent LDL value of 96. He is relatively asymptomatic although he occasionally feels a twinge or flutter in the chest. He is quick to downplay this and state that it is not pain. He also  denies any pressure, squeezing or burning. Sometimes he belches, and his symptoms improve. Otherwise he feels generally well and is in fairly good shape for his age. He does not do as much cardiovascular exercise as he knows he should. He is thinking  about starting a regular walking program. His ECG today in the office is normal. Notably previously during a treadmill stress test he had an abnormal ECG response with ST depression which prompted nuclear imaging.   __   PAST HISTORY     Past Medical Illnesses: No previous history of significant medical illnesses.;   Past Cardiac Illnesses: Hypertension, Hypercholesterolemia; Infectious Diseases : Chicken Pox; Surgical Procedures: Wisdom teeth extracted 1991; Trauma  History : No previous history of significant trauma.; Cardiology Procedures-Noninvasive: Treadmill Stress Test October 2003   ___  FAMILY HISTORY  Brother -- Coronary Artery Disease   Father -- Diabetes mellitus (55), Aortic aneurysm (75)  MaternalGrandparent -- Pulmonary embolism   Mother -- Diabetes mellitus (age 67), Hypertension (55)  PaternalGrandparent -- Diabetes mellitus   PaternalGrandparent -- Heart Attack    __  CARDIAC RISK FACTORS      Tobacco Abuse: has never used tobacco; Family History of Heart Disease: positive;  Hyperlipidemia: negative; Hypertension: negative;   Diabetes Mellitus: negative; Prior History of Heart Disease: negative;  Obesity: negative; Sedentary Conservation officer, historic buildings; Age :positive; Menopausal:not applicable  __  SOCIAL HISTORY     Alcohol Use: 1 mix drink every two weeks; Diet: Regular diet and Caffeine use-1-2 per day;  Exercise: Exercises regularly;   __  REVIEW OF SYSTEMS     General: Denies recent weight loss, weight gain, fever or chills or change in exercise tolerance.; Integumentary : hair loss; Eyes: wears eye glasses/contact lenses;  Ears, Nose, Throat, Mouth: Denies any hearing loss, epistaxis, hoarseness or difficulty speaking.;Respiratory : Denies dyspnea, cough, wheezing or hemoptysis.; Cardiovascular:  Please review HPI; Abdominal : Denies ulcer disease, hematochezia or melena.; Musculoskeletal:Denies any history of venous insufficiency, arthritic symptoms or back problems.; Neurological  : Denies any history of recurrent strokes, TIA, or seizure disorder.; Psychiatric:  Denies any history of depression, substance abuse or change in cognitive  functions.; Endocrine: Denies  any history of weight change, heat/cold intolerance, polydipsia, or polyuria; Hematologic/Immunologic:  Denies any food allergies, seasonal allergies, bleeding disorders.  __  PHYSICAL EXAMINATION     Vital Signs:  Blood Pressure:  124/84 Sitting, Right arm, regular cuff  122/82 Sitting, Left arm,   regular cuff    Weight: 171.00 lbs.  Height: 66"   BMI: 27   Pulse: 81/min. EKG        Constitutional: Cooperative, alert and oriented,well developed, well nourished, in no acute distress. Skin:  Warm and dry to touch, no apparent skin lesions, or masses noted. Head: Normocephalic, normal hair pattern,  no masses or tenderness Eyes: EOMS Intact, PERRL, conjunctivae and lids normal. Funduscopic exam and visual fields not performed.  ENT: Ears, Nose and throat reveal no gross abnormalities. No pallor or cyanosis. Dentition good. Neck : No palpable masses or adenopathy, no thyromegaly, no JVD, carotid pulses are full and equal bilaterally without bruits. Chest : Normal symmetry, no tenderness to palpation, normal respiratory excursion, no intercostal retraction, no use of accessory muscles, normal diaphragmatic excursion, clear to auscultation and percussion.  Cardiac: Regular rhythm, S1 normal, S2 normal, No S3 or S4, Apical impulse not displaced, no murmurs, gallops or rubs detected. Abdomen : Abdomen soft, bowel sounds normoactive, no masses, no hepatosplenomegaly, non-tender, no bruits Peripheral Pulses:  The femoral, popliteal, dorsalis pedis, and posterior tibial pulses are full and equal bilaterally with no bruits auscultated. Extremities/Back: No deformities,  clubbing, cyanosis, erythema or edema observed. There are no spinal abnormalities noted. Normal muscle strength and tone. Neurological:  No gross motor or sensory deficits noted, affect appropriate, oriented to time, person and place.   __    Medications added today by the physician:     ECG:  Normal sinus rhythm with normal axis, normal intervals and no ischemic changes.     IMPRESSIONS:   1. Very atypical chest symptoms, likely noncardiac but in the context of significant   cardiovascular risk factors.   2. Family history of premature atherosclerosis in his brother who had coronary artery disease   diagnosed at age 75.    3. Family history of valvular  heart disease and iliac aneurysm in his father, both necessitating   surgical repair in his 34s.   4. Dyslipidemia, controlled on atorvastatin.   5. Prior normal nuclear stress test in 2003.   6. History of  false positive ECG treadmill test result with ST depression which was later proven to   be a false positive by the aforementioned nuclear stress test.     RECOMMENDATIONS:   1. Repeat exercise nuclear stress test.   2. Echocardiogram to screen him for any valvular heart disease.   3. Aortoiliac duplex ultrasound to screen him for any aortic or iliac aneurysm given his fathers   history.   4. Further recommendations  are pending the results of the aforementioned testing. If it is all   normal/reassuring, I would like for him to return to see me in approximately two years for   screening/proactive purposes. If there is an abnormality that warrants closer followup  or any   further cardiac testing, then we will make those arrangements at that time.     Elliot Dally. Eliseo Gum, MD, [facility]    CRB/tucbh      cc: Dala Dock MD    shj  ____________________________  TODAYS ORDERS   2D, color flow, doppler [facility]  Diet mgmt edu, guidance and counseling TODAY  Treadmill Nuclear Study [facility]  12 Lead ECG Today  Return Visit 30 MIN 2 years  Aortoiliac Duplex [PVL] [facility]

## 2021-01-12 NOTE — Progress Notes (Signed)
[facility] OFF[facility] CONSULTATION NOTE    [facility] OFFICE -CARDIOLOGY  [address]  [address]  [place] [address]  Dept: [phone]  Dept Fax: [phone]         Patient Name: Juan Pena, Juan Pena    Date of Visit:  January 13, 2021  Date of Birth: 31-Jan-1961  AGE: 60 y.o.  Medical Record #: [id number]  Requesting Physician: Juan Oms, MD      CHIEF COMPLAINT:  Consult (Initial) (Overdue annual f/u)      HISTORY OF PRESENT ILLNESS    Juan Pena is being seen today for cardiovascular evaluation at the request of Juan Oms, MD. He is a pleasant 60 y.o. male who is here for routine office follow-up.      He reports that he is active and denies any exertional chest pain, palpitations, dyspnea or dizziness.    He has a past history of dyslipidemia.    He also has a family history of an iliac aneurysm in his father and premature CAD in his brother at the age of 68.    He was last seen by my partner 05/02/2017 at which time he was recommended to undergo an aortoiliac scan for screening which has not yet been completed.    PAST MEDICAL HISTORY: He has no past medical history on file. He has no past surgical history on file.    ALLERGIES: No Known Allergies    MEDICATIONS:   Current Outpatient Medications:     atorvastatin (LIPITOR) 10 MG tablet, Take 10 mg by mouth daily., Disp: , Rfl:      FAMILY HISTORY: family history is not on file.    SOCIAL HISTORY: He reports that he has never smoked. He has never used smokeless tobacco. He reports current alcohol use of about 1.0 standard drink per week. He reports that he does not use drugs.    REVIEW OF SYSTEMS:     General: Denies recent weight loss, weight gain, fever or chills or changes in exercise tolerance.:  Integumentary: Denies any change in hair or nails, rashes, or skin lesions  Eyes: Wears eyeglasses  Ears, Nose, Throat and Mouth: Denies any hearing loss, epistaxis, hoarseness or difficulty  speaking.  Respiratory: Denies dyspnea, cough, wheezing or hemoptysis  Cardiovascular: Please review HPI.  Abdominal: Denies ulcer disease, hematochezia or melena.  Musculoskeletal: Denies any venous insufficiency, arthritic symptoms or back problems.  Neurological: Denies any recurrent strokes, TIA, or seizure disorder  Psychiatric: Denies any depression, substance abuse or change in cognitive functions  Endocrine: Denies any weight change, heat/cold intolerance, polydipsia, or polyuria  Hematologic/Immunologic : Denies any food allergies, seasonal allergies, bleeding disorders      PHYSICAL EXAMINATION    Visit Vitals  BP (!) 140/92 (BP Site: Left arm, Patient Position: Sitting, Cuff Size: Medium)   Pulse 80   Ht 1.676 m (5\' 6" )   Wt 72 kg (158 lb 12.8 oz)   BMI 25.63 kg/m        General Appearance:  A well-appearing male in no acute distress.    Skin: Warm and dry to touch, no apparent skin lesions, or masses noted.  Head: Normocephalic, normal hair pattern, no masses or tenderness   Eyes: EOMS Intact, PERRL, conjunctivae and lids unremarkable.  ENT: Ears, Nose and throat reveal no gross abnormalities.  No pallor or cyanosis.  Neck: JVP normal, no carotid bruit, thyroid not enlarged   Chest: Clear to  auscultation bilaterally with good air movement and respiratory effort and no wheezes, rales, or rhonchi   Cardiovascular: Regular rhythm, S1 normal, S2 normal, No S3 or S4, Apical impulse not displaced.  2/6 holosystolic murmur LUSB  Abdomen: Soft, nontender, nondistended, with normoactive bowel sounds. No organomegaly.  No pulsatile masses, or bruits.   Extremities: Warm without edema. No clubbing, or cyanosis. All peripheral pulses are full and equal.   Neuro: Alert and oriented x3. No gross motor or sensory deficits noted, affect appropriate.      LABS:   No results found for: BMP, AST, ALT  No results found for: LIPID  No results found for: BNP    ECG: NSR, normal intervals, no significant ST-T  changes.    IMPRESSION:     Family history of premature atherosclerosis in his brother who had coronary artery disease diagnosed at age 90.   Family history of valvular heart disease and iliac aneurysm in his father, both necessitating surgical repair in his 13s.   Mild murmur 2/6 holosystolic, LUSB  Elevated BP without a diagnosis of hypertension.  HLD on atorvastatin.  12/2020 TC 174, TG 61, HDL 56, LDL 106  Normal nuc stress 05/2017 + 2003. (false positive ECG)  Normal echo 05/2017 - EF 65%    RECOMMENDATIONS:    Aortoiliac duplex ultrasound to screen him for any aortic or iliac aneurysm given his father's history.  2D echocardiogram as it has been >3 years from his last scan and there is a newly heard, although mild murmur at the left sternal border.  We discussed periodic BP checks at home, resting for 5 minutes.  His recent BP with Dr. Dyanne Carrel 12/21/2020 was normal at 130/80.  Results will be conveyed to the patient for the above-mentioned tests.  If no significant abnormalities are noted he can then follow-up on an PRN basis.                                                 Orders Placed This Encounter   Procedures    US Aorta Ivc Iliac Art/Grft Duplex Dopp Comp    ECG 12 lead (Normal)    Echocardiogram Adult Complete W Clr/ Dopp Waveform       No orders of the defined types were placed in this encounter.      SIGNED:    Samantha Crimes, MD     This note was generated by the Dragon speech recognition and may contain errors or omissions not intended by the user. Grammatical errors, random word insertions, deletions, pronoun errors, and incomplete sentences are occasional consequences of this technology due to software limitations. Not all errors are caught or corrected. If there are questions or concerns about the content of this note or information contained within the body of this dictation, they should be addressed directly with the author for clarification.

## 2021-01-13 ENCOUNTER — Encounter (INDEPENDENT_AMBULATORY_CARE_PROVIDER_SITE_OTHER): Payer: Self-pay | Admitting: Cardiovascular Disease

## 2021-01-13 ENCOUNTER — Ambulatory Visit (INDEPENDENT_AMBULATORY_CARE_PROVIDER_SITE_OTHER): Payer: Commercial Managed Care - POS | Admitting: Cardiovascular Disease

## 2021-01-13 VITALS — BP 140/92 | HR 80 | Ht 66.0 in | Wt 158.8 lb

## 2021-01-13 DIAGNOSIS — E785 Hyperlipidemia, unspecified: Secondary | ICD-10-CM

## 2021-01-13 DIAGNOSIS — Z8249 Family history of ischemic heart disease and other diseases of the circulatory system: Secondary | ICD-10-CM

## 2021-01-14 ENCOUNTER — Encounter (INDEPENDENT_AMBULATORY_CARE_PROVIDER_SITE_OTHER): Payer: Self-pay

## 2021-02-15 ENCOUNTER — Ambulatory Visit: Payer: Commercial Managed Care - POS

## 2021-03-23 ENCOUNTER — Ambulatory Visit: Payer: Commercial Managed Care - POS

## 2021-06-01 ENCOUNTER — Ambulatory Visit
Admission: RE | Admit: 2021-06-01 | Discharge: 2021-06-01 | Disposition: A | Discharge status: Home / Self Care | Payer: Commercial Managed Care - POS | Source: Ambulatory Visit | Attending: Cardiovascular Disease | Admitting: Cardiovascular Disease

## 2021-06-01 DIAGNOSIS — I700 Atherosclerosis of aorta: Secondary | ICD-10-CM | POA: Insufficient documentation

## 2021-06-01 DIAGNOSIS — I77811 Abdominal aortic ectasia: Secondary | ICD-10-CM | POA: Insufficient documentation

## 2021-06-01 DIAGNOSIS — E7849 Other hyperlipidemia: Secondary | ICD-10-CM | POA: Insufficient documentation

## 2021-06-01 DIAGNOSIS — E785 Hyperlipidemia, unspecified: Secondary | ICD-10-CM

## 2021-06-01 DIAGNOSIS — Z8249 Family history of ischemic heart disease and other diseases of the circulatory system: Secondary | ICD-10-CM | POA: Insufficient documentation

## 2021-06-02 ENCOUNTER — Encounter (INDEPENDENT_AMBULATORY_CARE_PROVIDER_SITE_OTHER): Payer: Self-pay

## 2021-06-02 ENCOUNTER — Telehealth (INDEPENDENT_AMBULATORY_CARE_PROVIDER_SITE_OTHER): Payer: Self-pay

## 2021-06-02 DIAGNOSIS — I77811 Abdominal aortic ectasia: Secondary | ICD-10-CM

## 2021-06-02 NOTE — Telephone Encounter (Addendum)
Left voicemail to call back or check mychart for aortoiliac duplex results. Mychart message sent. Repeat study ordered.     ----- Message from  Crimes, MD sent at 06/01/2021 10:14 AM EST -----  Regarding: PV results: Please contact pt  Aortoiliac ultrasound results-his father has a history of abdominal aneurysm.  Thankfully his scan shows no evidence of aneurysm.  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  No significant blockages are noted.  Can we please order repeat scan in 3 years for surveillance.

## 2024-03-13 ENCOUNTER — Encounter (INDEPENDENT_AMBULATORY_CARE_PROVIDER_SITE_OTHER): Payer: Self-pay

## 2024-05-22 ENCOUNTER — Ambulatory Visit (INDEPENDENT_AMBULATORY_CARE_PROVIDER_SITE_OTHER)

## 2024-05-22 DIAGNOSIS — I77811 Abdominal aortic ectasia: Secondary | ICD-10-CM

## 2024-06-04 ENCOUNTER — Encounter (INDEPENDENT_AMBULATORY_CARE_PROVIDER_SITE_OTHER)

## 2024-06-04 ENCOUNTER — Encounter (INDEPENDENT_AMBULATORY_CARE_PROVIDER_SITE_OTHER): Payer: Self-pay | Admitting: Cardiovascular Disease

## 2024-06-04 NOTE — Progress Notes (Signed)
 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
